# Patient Record
Sex: Female | Born: 1945 | Race: White | Hispanic: No | Marital: Married
Health system: Midwestern US, Academic
[De-identification: ages and names within clinical notes are randomized; demographics above are authoritative.]

---

## 2016-08-29 ENCOUNTER — Encounter: Admit: 2016-08-29 | Discharge: 2016-08-29 | Payer: MEDICARE

## 2016-09-12 ENCOUNTER — Ambulatory Visit: Admit: 2016-09-12 | Discharge: 2016-09-13 | Payer: MEDICARE

## 2016-09-12 DIAGNOSIS — E6609 Other obesity due to excess calories: ICD-10-CM

## 2016-09-12 DIAGNOSIS — E7800 Pure hypercholesterolemia, unspecified: ICD-10-CM

## 2016-09-12 DIAGNOSIS — I10 Essential (primary) hypertension: Principal | ICD-10-CM

## 2016-09-12 MED ORDER — REGADENOSON 0.4 MG/5 ML IV SYRG
.4 mg | Freq: Once | INTRAVENOUS | 0 refills | Status: CP
Start: 2016-09-12 — End: ?

## 2016-09-12 NOTE — Progress Notes
Peripheral IV Insertion Note:  Patient Side: left  Line Orientation:Hand  IV Catheter Size: 22G  Number of Attempts:1.  IV capped and flushed with Normal Saline.  IV site without redness, swelling, or pain.  New dressing placed.    After procedure IV cannula removed intact and hemostasis achieved.

## 2016-10-21 ENCOUNTER — Encounter: Admit: 2016-10-21 | Discharge: 2016-10-21 | Payer: MEDICARE

## 2016-10-21 MED ORDER — METOPROLOL SUCCINATE 50 MG PO TB24
25 mg | ORAL_TABLET | Freq: Two times a day (BID) | ORAL | 3 refills | 90.00000 days | Status: AC
Start: 2016-10-21 — End: 2018-09-14

## 2017-03-12 ENCOUNTER — Encounter: Admit: 2017-03-12 | Discharge: 2017-03-12 | Payer: MEDICARE

## 2017-03-12 DIAGNOSIS — K5090 Crohn's disease, unspecified, without complications: Principal | ICD-10-CM

## 2017-03-12 DIAGNOSIS — M810 Age-related osteoporosis without current pathological fracture: ICD-10-CM

## 2017-03-12 DIAGNOSIS — R413 Other amnesia: ICD-10-CM

## 2017-03-12 DIAGNOSIS — I471 Supraventricular tachycardia: ICD-10-CM

## 2017-03-12 DIAGNOSIS — E785 Hyperlipidemia, unspecified: ICD-10-CM

## 2017-03-12 DIAGNOSIS — G43909 Migraine, unspecified, not intractable, without status migrainosus: ICD-10-CM

## 2017-03-12 DIAGNOSIS — R002 Palpitations: ICD-10-CM

## 2017-03-12 DIAGNOSIS — G8929 Other chronic pain: ICD-10-CM

## 2017-03-12 DIAGNOSIS — M797 Fibromyalgia: ICD-10-CM

## 2017-03-12 DIAGNOSIS — M1990 Unspecified osteoarthritis, unspecified site: ICD-10-CM

## 2017-03-12 DIAGNOSIS — R32 Unspecified urinary incontinence: ICD-10-CM

## 2017-03-12 DIAGNOSIS — G2581 Restless legs syndrome: ICD-10-CM

## 2017-03-12 DIAGNOSIS — I10 Essential (primary) hypertension: ICD-10-CM

## 2017-03-12 DIAGNOSIS — J45909 Unspecified asthma, uncomplicated: ICD-10-CM

## 2017-03-12 NOTE — Telephone Encounter
Initial Visit Date: 04/06/17    Reason for Visit: memory loss    Is this as a result of an injury? head injury due to fall- admission at [facility] bilateral subdural hematoma 05/23/15    Physician Information  PCP: Dr. Larina Bras  Referring Physician: Dr. Orson Gear at [facility] and [facility]  Previous Neurologist: Dr. Hoyt Koch [facility]  Other providers seen: Dr. Artis Delay [place] [facility], [facility], Dr. Mirian Mo [facility], Dr. Nickolas Madrid [place] [facility], Dr. Jackquline [place] ortho    Previous Imaging/Procedures:  CT head- 07/03/15, 06/05/15, 05/25/15, 05/24/15 [place]; 05/23/15 [facility] (OR); 02/28/11, 01/16/15?  CT C-spine- 05/23/15 [facility]/OR; 05/24/15 [place]  CT T-spine- 05/24/15 [place]  CT L-spine- 05/24/15 [place]  MRI head- 02/12/15?  MRI C-spine- 05/24/15 [place]; 1984  MRI L-spine- 11/17/13 [place]  XRay L-spine- 10/06/13 [place]; 10/25/15?  EMG none  EEG 02/21/15 [facility] (OR) 04/05/15?  Sleep study- 01/29/13  US carotid- 05/23/15 [place]    ED/Hospitalizations:  [facility] ER 11/23/15 nose bleed; 10/26/15 fall; 05/23/15 fall (OR); 01/16/15 facial numbness, headache;   [facility] admission 05/23/15 fall    Current Meds:  donepezil    Medications, Allergies, History Updated

## 2017-04-06 ENCOUNTER — Ambulatory Visit: Admit: 2017-04-06 | Discharge: 2017-04-07 | Payer: MEDICARE

## 2017-04-06 ENCOUNTER — Encounter: Admit: 2017-04-06 | Discharge: 2017-04-06 | Payer: MEDICARE

## 2017-04-06 DIAGNOSIS — G43909 Migraine, unspecified, not intractable, without status migrainosus: ICD-10-CM

## 2017-04-06 DIAGNOSIS — M1990 Unspecified osteoarthritis, unspecified site: ICD-10-CM

## 2017-04-06 DIAGNOSIS — I471 Supraventricular tachycardia: ICD-10-CM

## 2017-04-06 DIAGNOSIS — J45909 Unspecified asthma, uncomplicated: ICD-10-CM

## 2017-04-06 DIAGNOSIS — R32 Unspecified urinary incontinence: ICD-10-CM

## 2017-04-06 DIAGNOSIS — M810 Age-related osteoporosis without current pathological fracture: ICD-10-CM

## 2017-04-06 DIAGNOSIS — M797 Fibromyalgia: ICD-10-CM

## 2017-04-06 DIAGNOSIS — K5090 Crohn's disease, unspecified, without complications: Principal | ICD-10-CM

## 2017-04-06 DIAGNOSIS — E785 Hyperlipidemia, unspecified: ICD-10-CM

## 2017-04-06 DIAGNOSIS — R002 Palpitations: ICD-10-CM

## 2017-04-06 DIAGNOSIS — I10 Essential (primary) hypertension: ICD-10-CM

## 2017-04-06 DIAGNOSIS — G8929 Other chronic pain: ICD-10-CM

## 2017-04-06 DIAGNOSIS — R413 Other amnesia: ICD-10-CM

## 2017-04-06 DIAGNOSIS — G2581 Restless legs syndrome: ICD-10-CM

## 2017-04-07 DIAGNOSIS — R4189 Other symptoms and signs involving cognitive functions and awareness: Principal | ICD-10-CM

## 2017-04-07 LAB — LIVER FUNCTION PANEL
Lab: 0.1 mg/dL (ref <=0.2)
Lab: 0.4 mg/dL (ref 0.2–1.2)
Lab: 2.5 g/dL (ref >=60)
Lab: 4.5 g/dL (ref <9)
Lab: 7 g/dL (ref 6.1–8.1)

## 2017-04-07 LAB — VITAMIN B12: Lab: 101 pg/mL (ref >=60)

## 2017-04-21 ENCOUNTER — Encounter: Admit: 2017-04-21 | Discharge: 2017-04-21 | Payer: MEDICARE

## 2017-05-04 ENCOUNTER — Ambulatory Visit: Admit: 2017-05-04 | Discharge: 2017-05-04 | Payer: MEDICARE

## 2017-05-04 ENCOUNTER — Encounter: Admit: 2017-05-04 | Discharge: 2017-05-04 | Payer: MEDICARE

## 2017-05-04 DIAGNOSIS — R413 Other amnesia: ICD-10-CM

## 2017-05-04 DIAGNOSIS — M797 Fibromyalgia: ICD-10-CM

## 2017-05-04 DIAGNOSIS — S06361A Traumatic hemorrhage of cerebrum, unspecified, with loss of consciousness of 30 minutes or less, initial encounter: ICD-10-CM

## 2017-05-04 DIAGNOSIS — R4189 Other symptoms and signs involving cognitive functions and awareness: Principal | ICD-10-CM

## 2017-05-04 DIAGNOSIS — G2581 Restless legs syndrome: ICD-10-CM

## 2017-05-04 DIAGNOSIS — S069X9A Unspecified intracranial injury with loss of consciousness of unspecified duration, initial encounter: ICD-10-CM

## 2017-05-04 DIAGNOSIS — G8929 Other chronic pain: ICD-10-CM

## 2017-05-04 DIAGNOSIS — M810 Age-related osteoporosis without current pathological fracture: ICD-10-CM

## 2017-05-04 DIAGNOSIS — R32 Unspecified urinary incontinence: ICD-10-CM

## 2017-05-04 DIAGNOSIS — S06300S Unspecified focal traumatic brain injury without loss of consciousness, sequela: ICD-10-CM

## 2017-05-04 DIAGNOSIS — E785 Hyperlipidemia, unspecified: ICD-10-CM

## 2017-05-04 DIAGNOSIS — M1990 Unspecified osteoarthritis, unspecified site: ICD-10-CM

## 2017-05-04 DIAGNOSIS — I471 Supraventricular tachycardia: ICD-10-CM

## 2017-05-04 DIAGNOSIS — I10 Essential (primary) hypertension: ICD-10-CM

## 2017-05-04 DIAGNOSIS — R002 Palpitations: ICD-10-CM

## 2017-05-04 DIAGNOSIS — K5090 Crohn's disease, unspecified, without complications: Principal | ICD-10-CM

## 2017-05-04 DIAGNOSIS — J45909 Unspecified asthma, uncomplicated: ICD-10-CM

## 2017-05-04 DIAGNOSIS — G43909 Migraine, unspecified, not intractable, without status migrainosus: ICD-10-CM

## 2017-06-29 ENCOUNTER — Encounter: Admit: 2017-06-29 | Discharge: 2017-06-29 | Payer: MEDICARE

## 2017-06-29 ENCOUNTER — Ambulatory Visit: Admit: 2017-06-29 | Discharge: 2017-06-30 | Payer: MEDICARE

## 2017-06-29 DIAGNOSIS — I471 Supraventricular tachycardia: ICD-10-CM

## 2017-06-29 DIAGNOSIS — M797 Fibromyalgia: ICD-10-CM

## 2017-06-29 DIAGNOSIS — I10 Essential (primary) hypertension: ICD-10-CM

## 2017-06-29 DIAGNOSIS — R32 Unspecified urinary incontinence: ICD-10-CM

## 2017-06-29 DIAGNOSIS — J45909 Unspecified asthma, uncomplicated: ICD-10-CM

## 2017-06-29 DIAGNOSIS — R413 Other amnesia: ICD-10-CM

## 2017-06-29 DIAGNOSIS — G2581 Restless legs syndrome: ICD-10-CM

## 2017-06-29 DIAGNOSIS — R002 Palpitations: ICD-10-CM

## 2017-06-29 DIAGNOSIS — M810 Age-related osteoporosis without current pathological fracture: ICD-10-CM

## 2017-06-29 DIAGNOSIS — G43909 Migraine, unspecified, not intractable, without status migrainosus: ICD-10-CM

## 2017-06-29 DIAGNOSIS — R4189 Other symptoms and signs involving cognitive functions and awareness: Principal | ICD-10-CM

## 2017-06-29 DIAGNOSIS — E785 Hyperlipidemia, unspecified: ICD-10-CM

## 2017-06-29 DIAGNOSIS — S069X9A Unspecified intracranial injury with loss of consciousness of unspecified duration, initial encounter: ICD-10-CM

## 2017-06-29 DIAGNOSIS — K5090 Crohn's disease, unspecified, without complications: Principal | ICD-10-CM

## 2017-06-29 DIAGNOSIS — M1990 Unspecified osteoarthritis, unspecified site: ICD-10-CM

## 2017-06-29 DIAGNOSIS — G8929 Other chronic pain: ICD-10-CM

## 2017-11-12 ENCOUNTER — Encounter: Admit: 2017-11-12 | Discharge: 2017-11-12 | Payer: MEDICARE

## 2017-11-12 ENCOUNTER — Ambulatory Visit: Admit: 2017-11-12 | Discharge: 2017-11-13 | Payer: MEDICARE

## 2017-11-12 DIAGNOSIS — K5090 Crohn's disease, unspecified, without complications: Principal | ICD-10-CM

## 2017-11-12 DIAGNOSIS — M797 Fibromyalgia: ICD-10-CM

## 2017-11-12 DIAGNOSIS — G8929 Other chronic pain: ICD-10-CM

## 2017-11-12 DIAGNOSIS — K50919 Crohn's disease, unspecified, with unspecified complications: ICD-10-CM

## 2017-11-12 DIAGNOSIS — R32 Unspecified urinary incontinence: ICD-10-CM

## 2017-11-12 DIAGNOSIS — I10 Essential (primary) hypertension: ICD-10-CM

## 2017-11-12 DIAGNOSIS — M810 Age-related osteoporosis without current pathological fracture: ICD-10-CM

## 2017-11-12 DIAGNOSIS — E6609 Other obesity due to excess calories: ICD-10-CM

## 2017-11-12 DIAGNOSIS — I471 Supraventricular tachycardia: ICD-10-CM

## 2017-11-12 DIAGNOSIS — W19XXXA Unspecified fall, initial encounter: ICD-10-CM

## 2017-11-12 DIAGNOSIS — G2581 Restless legs syndrome: ICD-10-CM

## 2017-11-12 DIAGNOSIS — R413 Other amnesia: ICD-10-CM

## 2017-11-12 DIAGNOSIS — S06361A Traumatic hemorrhage of cerebrum, unspecified, with loss of consciousness of 30 minutes or less, initial encounter: ICD-10-CM

## 2017-11-12 DIAGNOSIS — M1990 Unspecified osteoarthritis, unspecified site: ICD-10-CM

## 2017-11-12 DIAGNOSIS — E785 Hyperlipidemia, unspecified: ICD-10-CM

## 2017-11-12 DIAGNOSIS — S069X9A Unspecified intracranial injury with loss of consciousness of unspecified duration, initial encounter: ICD-10-CM

## 2017-11-12 DIAGNOSIS — R002 Palpitations: ICD-10-CM

## 2017-11-12 DIAGNOSIS — G43909 Migraine, unspecified, not intractable, without status migrainosus: ICD-10-CM

## 2017-11-12 DIAGNOSIS — S02119A Unspecified fracture of occiput, initial encounter for closed fracture: ICD-10-CM

## 2017-11-12 DIAGNOSIS — J45909 Unspecified asthma, uncomplicated: ICD-10-CM

## 2017-11-12 DIAGNOSIS — E7800 Pure hypercholesterolemia, unspecified: Principal | ICD-10-CM

## 2017-11-16 LAB — LIPID PROFILE
Lab: 144 — ABNORMAL LOW (ref 150–200)
Lab: 146
Lab: 44
Lab: 80

## 2017-11-16 LAB — LIVER FUNCTION PANEL: Lab: 0.7

## 2017-11-17 ENCOUNTER — Encounter: Admit: 2017-11-17 | Discharge: 2017-11-17 | Payer: MEDICARE

## 2017-11-17 DIAGNOSIS — I10 Essential (primary) hypertension: ICD-10-CM

## 2017-11-17 DIAGNOSIS — E7800 Pure hypercholesterolemia, unspecified: Principal | ICD-10-CM

## 2018-03-25 ENCOUNTER — Encounter: Admit: 2018-03-25 | Discharge: 2018-03-25 | Payer: MEDICARE

## 2018-03-26 MED ORDER — ALLOPURINOL 300 MG PO TAB
ORAL_TABLET | Freq: Every day | 0 refills
Start: 2018-03-26 — End: ?

## 2018-06-24 ENCOUNTER — Encounter: Admit: 2018-06-24 | Discharge: 2018-06-24 | Payer: MEDICARE

## 2018-06-30 ENCOUNTER — Encounter: Admit: 2018-06-30 | Discharge: 2018-06-30 | Payer: MEDICARE

## 2018-06-30 ENCOUNTER — Ambulatory Visit: Admit: 2018-06-30 | Discharge: 2018-07-01 | Payer: MEDICARE

## 2018-06-30 DIAGNOSIS — K5090 Crohn's disease, unspecified, without complications: Principal | ICD-10-CM

## 2018-06-30 DIAGNOSIS — I10 Essential (primary) hypertension: ICD-10-CM

## 2018-06-30 DIAGNOSIS — S069X9A Unspecified intracranial injury with loss of consciousness of unspecified duration, initial encounter: ICD-10-CM

## 2018-06-30 DIAGNOSIS — M810 Age-related osteoporosis without current pathological fracture: ICD-10-CM

## 2018-06-30 DIAGNOSIS — R002 Palpitations: ICD-10-CM

## 2018-06-30 DIAGNOSIS — M797 Fibromyalgia: ICD-10-CM

## 2018-06-30 DIAGNOSIS — G2581 Restless legs syndrome: ICD-10-CM

## 2018-06-30 DIAGNOSIS — J45909 Unspecified asthma, uncomplicated: ICD-10-CM

## 2018-06-30 DIAGNOSIS — G43909 Migraine, unspecified, not intractable, without status migrainosus: ICD-10-CM

## 2018-06-30 DIAGNOSIS — M1990 Unspecified osteoarthritis, unspecified site: ICD-10-CM

## 2018-06-30 DIAGNOSIS — R32 Unspecified urinary incontinence: ICD-10-CM

## 2018-06-30 DIAGNOSIS — R413 Other amnesia: ICD-10-CM

## 2018-06-30 DIAGNOSIS — I471 Supraventricular tachycardia: ICD-10-CM

## 2018-06-30 DIAGNOSIS — E785 Hyperlipidemia, unspecified: ICD-10-CM

## 2018-06-30 DIAGNOSIS — G8929 Other chronic pain: ICD-10-CM

## 2018-06-30 MED ORDER — CITALOPRAM 20 MG PO TAB
20 mg | ORAL_TABLET | Freq: Every day | ORAL | 3 refills | Status: AC
Start: 2018-06-30 — End: ?

## 2018-06-30 NOTE — Progress Notes
Obtained patient's verbal consent to treat them and their agreement to [facility] financial policy and NPP via this telehealth visit during the [facility] Emergency    The patient reported some mood issues and has some problem in remembering names.     Neuropsychological testing did not show any evidence of an emerging dementia.       Recommendations;     1. Start a low dose of citalopram.       Audio + Video: Zoom    Time: 10 minutes.

## 2018-07-01 DIAGNOSIS — R4189 Other symptoms and signs involving cognitive functions and awareness: Principal | ICD-10-CM

## 2018-09-14 ENCOUNTER — Ambulatory Visit: Admit: 2018-09-14 | Discharge: 2018-09-15

## 2018-09-14 ENCOUNTER — Encounter: Admit: 2018-09-14 | Discharge: 2018-09-14

## 2018-09-14 DIAGNOSIS — M797 Fibromyalgia: Secondary | ICD-10-CM

## 2018-09-14 DIAGNOSIS — G8929 Other chronic pain: Secondary | ICD-10-CM

## 2018-09-14 DIAGNOSIS — R413 Other amnesia: Secondary | ICD-10-CM

## 2018-09-14 DIAGNOSIS — I10 Essential (primary) hypertension: Secondary | ICD-10-CM

## 2018-09-14 DIAGNOSIS — M1990 Unspecified osteoarthritis, unspecified site: Secondary | ICD-10-CM

## 2018-09-14 DIAGNOSIS — K5090 Crohn's disease, unspecified, without complications: Secondary | ICD-10-CM

## 2018-09-14 DIAGNOSIS — E785 Hyperlipidemia, unspecified: Secondary | ICD-10-CM

## 2018-09-14 DIAGNOSIS — S069X9A Unspecified intracranial injury with loss of consciousness of unspecified duration, initial encounter: Secondary | ICD-10-CM

## 2018-09-14 DIAGNOSIS — I471 Supraventricular tachycardia: Secondary | ICD-10-CM

## 2018-09-14 DIAGNOSIS — J45909 Unspecified asthma, uncomplicated: Secondary | ICD-10-CM

## 2018-09-14 DIAGNOSIS — E7800 Pure hypercholesterolemia, unspecified: Secondary | ICD-10-CM

## 2018-09-14 DIAGNOSIS — M810 Age-related osteoporosis without current pathological fracture: Secondary | ICD-10-CM

## 2018-09-14 DIAGNOSIS — R32 Unspecified urinary incontinence: Secondary | ICD-10-CM

## 2018-09-14 DIAGNOSIS — G2581 Restless legs syndrome: Secondary | ICD-10-CM

## 2018-09-14 DIAGNOSIS — G43909 Migraine, unspecified, not intractable, without status migrainosus: Secondary | ICD-10-CM

## 2018-09-14 DIAGNOSIS — R002 Palpitations: Secondary | ICD-10-CM

## 2018-09-14 MED ORDER — LEVOTHYROXINE 50 MCG PO TAB
50 ug | ORAL_TABLET | Freq: Every day | ORAL | 3 refills | 30.00000 days | Status: AC
Start: 2018-09-14 — End: ?

## 2018-09-14 MED ORDER — METOPROLOL SUCCINATE 50 MG PO TB24
50 mg | ORAL_TABLET | Freq: Two times a day (BID) | ORAL | 3 refills | 90.00000 days | Status: DC
Start: 2018-09-14 — End: 2019-09-21

## 2018-09-14 NOTE — Progress Notes
Date of Service: 09/14/2018    Jeanette Bush is a 73 y.o. female.       HPI     Patient is a very pleasant 73 year old white female, she was last seen in the office in August 2019.  She has a history of hypertension, hyperlipidemia, obesity, previous heart palpitations that have been very well contained and treated with beta-blocker, she also has fibromyalgia.    In the past patient did sustain a fall that resulted in bilateral subdural hematoma, she underwent physical rehabilitation and extensive physical therapy and recovered well.    Currently Mrs. Jeanette Bush is under a lot of stress and she is packing her house in anticipation of moving to [place], [place] at the end of this month where the husband accepted a new job as Associate Professor.    She has not had any symptoms of chest pain, no heart palpitations.  She has been experiencing shortness of breath with physical activity and she is also very fatigued, that is probably due to a lot of work in trying to pack up her house.         Vitals:    09/14/18 1400 09/14/18 1418   BP: 114/74 114/74   BP Source: Arm, Left Upper Arm, Right Upper   Pulse: 72    SpO2: 97%    Weight: 106.2 kg (234 lb 3.2 oz)    Height: 1.626 m (5' 4)    PainSc: Zero      Body mass index is 40.2 kg/m???.     Past Medical History  Patient Active Problem List    Diagnosis Date Noted   ??? Class 2 obesity due to excess calories in adult 01/01/2016   ??? Cervical spine pain 05/24/2015   ??? Cerebral edema (HCC) 05/24/2015   ??? Intraparenchymal hematoma of brain (HCC) 05/24/2015   ??? Closed fracture of occipital bone (HCC) 05/24/2015   ??? Subdural hematoma (HCC) 05/23/2015   ??? Altered mental status 05/23/2015   ??? Fall 05/23/2015   ??? Heart palpitations 07/21/2013   ??? Pre-operative cardiovascular examination 06/25/2011   ??? Obesity, Class III, BMI 40-49.9 (morbid obesity) (HCC) 06/25/2011   ??? Atypical chest pain 09/13/2010   ??? Morbidly obese (HCC) 09/13/2010   ??? Left axis deviation 09/13/2010 ??? Hyperlipidemia 06/12/2010   ??? SVT (supraventricular tachycardia) (HCC) 01/11/2010   ??? Palpitations 01/11/2010   ??? Crohn's disease (HCC) 06/14/2009   ??? Fibromyalgia 06/14/2009   ??? RLS (restless legs syndrome) 06/14/2009   ??? Asthma 06/14/2009   ??? HTN (hypertension) 06/14/2009   ??? Osteoporosis 06/14/2009   ??? Migraine 06/14/2009   ??? Chronic pain 06/14/2009   ??? Urinary incontinence 06/14/2009         Review of Systems   Constitution: Negative.   HENT: Negative.    Eyes: Negative.    Cardiovascular: Negative.    Respiratory: Negative.    Endocrine: Negative.    Hematologic/Lymphatic: Negative.    Skin: Negative.    Musculoskeletal: Negative.    Gastrointestinal: Negative.    Genitourinary: Negative.    Neurological: Negative.    Psychiatric/Behavioral: Negative.    Allergic/Immunologic: Negative.        Physical Exam  General Appearance: normal in appearance  Skin: warm, moist, no ulcers or xanthomas  Eyes: conjunctivae and lids normal, pupils are equal and round  Lips & Oral Mucosa: no pallor or cyanosis  Neck Veins: neck veins are flat, neck veins are not distended  Chest Inspection: chest is  normal in appearance  Respiratory Effort: breathing comfortably, no respiratory distress  Auscultation/Percussion: lungs clear to auscultation, no rales or rhonchi, no wheezing  Cardiac Rhythm: regular rhythm and normal rate  Cardiac Auscultation: S1, S2 normal, no rub, no gallop  Murmurs: no murmur  Carotid Arteries: normal carotid upstroke bilaterally, no bruit  Lower Extremity Edema: no lower extremity edema  Abdominal Exam: soft, non-tender, no masses, bowel sounds normal  Liver & Spleen: no organomegaly  Language and Memory: patient responsive and seems to comprehend information  Neurologic Exam: neurological assessment grossly intact      Cardiovascular Studies      Problems Addressed Today  Encounter Diagnoses   Name Primary?   ??? SVT (supraventricular tachycardia) (HCC) Yes   ??? Pure hypercholesterolemia ??? Essential hypertension    ??? Obesity, Class III, BMI 40-49.9 (morbid obesity) (HCC)    ??? Fibromyalgia        Assessment and Plan     In summary: This is a 73 year old white female that has a history of SVT documented in the past, patient also has symptomatic heart palpitations, she was started on a long-acting beta-blocker and her symptoms have been under good control and she has not had any significant recurrent episodes, in addition she does have a history of obesity with a BMI of 40.2, in the past patient did manage to lose 40 pounds, she also has a history of falls that resulted in bilateral subdural hematoma, patient was admitted at [facility] in February/March 2017, at that time she underwent extensive work-up for syncope and no significant pathology was discovered.    The the Foley resulted in occipital fracture and subdural hematoma with midline shift, patient was seen in neurosurgical consult, the recommended conservative therapy, patient recovered well following that episode.    Plan:    1.  Continue all current medications, will renew the prescription for levothyroxine and metoprolol XL  2.  After moving to [facility], [place] I do recommend to establish care with a primary care physician and continue to see a cardiologist.    Should have any questions feel free to call our office or he can call my cell phone at [phone].         Current Medications (including today's revisions)  ??? acetaminophen (TYLENOL) 500 mg tablet Take 1,000 mg by mouth as Needed.   ??? albuterol (PROAIR HFA) 90 mcg/actuation inhaler Inhale 1 Puff by mouth into the lungs every 6 hours as needed for Wheezing or Shortness of Breath. Shake well before use.   ??? allopurinol (ZYLOPRIM) 300 mg tablet Take 300 mg by mouth daily.     ??? aspirin 81 mg chewable tablet Chew 81 mg by mouth daily. Take with food.   ??? calcium carbonate/vitamin D-3 (OSCAL-500+D) 1250 mg/200 unit tablet Take 1 tablet by mouth daily. At noon, Calcium Carb 1250mg  delivers 500mg  elemental Ca   ??? cetirizine (ZYRTEC) 10 mg tablet Take 10 mg by mouth daily.   ??? citalopram (CELEXA) 20 mg tablet Take one tablet by mouth daily.   ??? docusate (COLACE) 100 mg capsule Take 100 mg by mouth twice daily.   ??? ergocalciferol (VITAMIN D-2) 50,000 unit capsule Take 1 capsule by mouth every 7 days.   ??? fluticasone propionate (FLONASE) 50 mcg/actuation nasal spray Apply  to each nostril as directed daily. Shake bottle gently before using.   ??? HYDROcodone/acetaminophen (NORCO) 5/325 mg tablet Take 1 tablet by mouth every 6 hours as needed     ???  ibuprofen (MOTRIN) 800 mg tablet Take 800 mg by mouth as Needed for Pain. Take with food.   ??? levothyroxine (SYNTHROID) 50 mcg tablet Take 50 mcg by mouth daily 30 minutes before breakfast.   ??? loperamide (IMODIUM A-D) 2 mg capsule Take 2-4 mg by mouth as Needed.   ??? metoprolol XL (TOPROL XL) 50 mg extended release tablet Take 0.5 tablets by mouth twice daily. (Patient taking differently: Take 50 mg by mouth twice daily.)   ??? montelukast (SINGULAIR) 10 mg tablet Take 10 mg by mouth at bedtime daily.   ??? MV with Min-Lycopene-Lutein (CENTRUM SILVER) 0.4-300-250 mg-mcg-mcg tab Take 1 tablet by mouth daily. At noon   ??? pantoprazole DR (PROTONIX) 40 mg tablet Take 40 mg by mouth twice daily.   ??? sulfADIAZINE 500 mg tablet Take 500 mg by mouth three times daily.   ??? sulfADIAZINE 500 mg tablet Take 1,000 mg by mouth three times daily.   ??? vitamins, B complex tab Take 1 tablet by mouth daily. At noon

## 2019-02-19 IMAGING — CR PELVIS
5 series · 5 of 5 positions shown · non-contrast
Comparison: none

[l-spine ap]
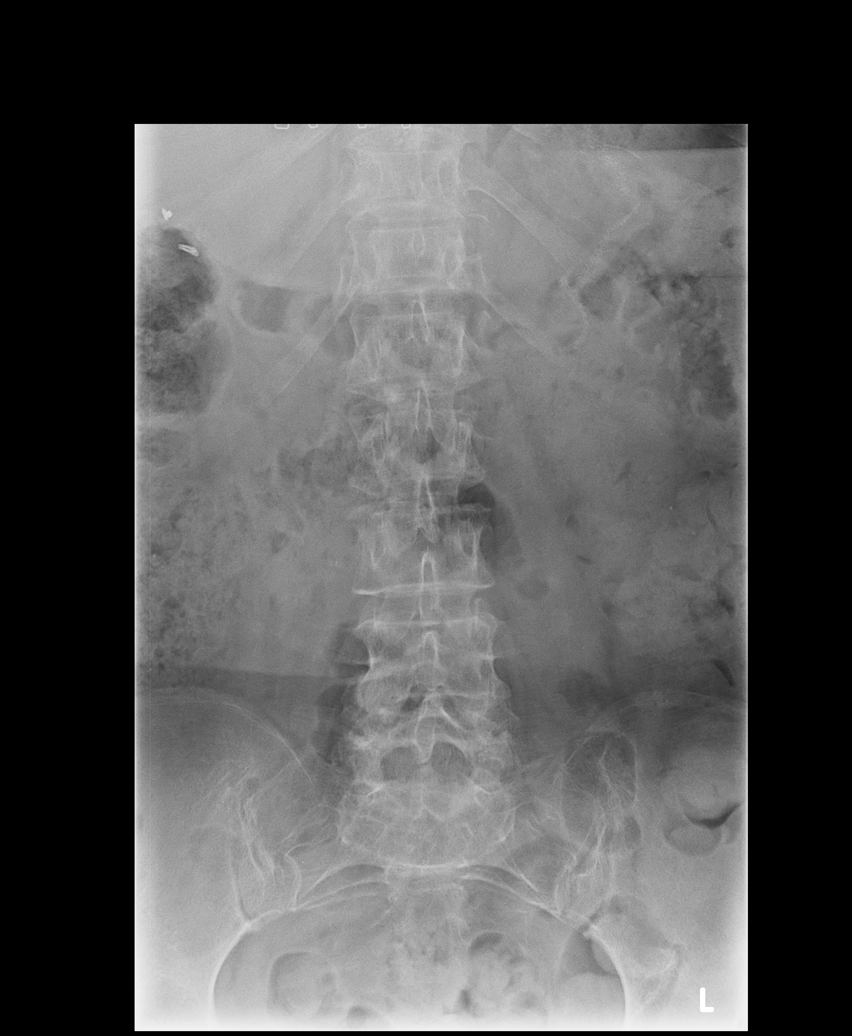

[l-spine obl (1 of 2)]
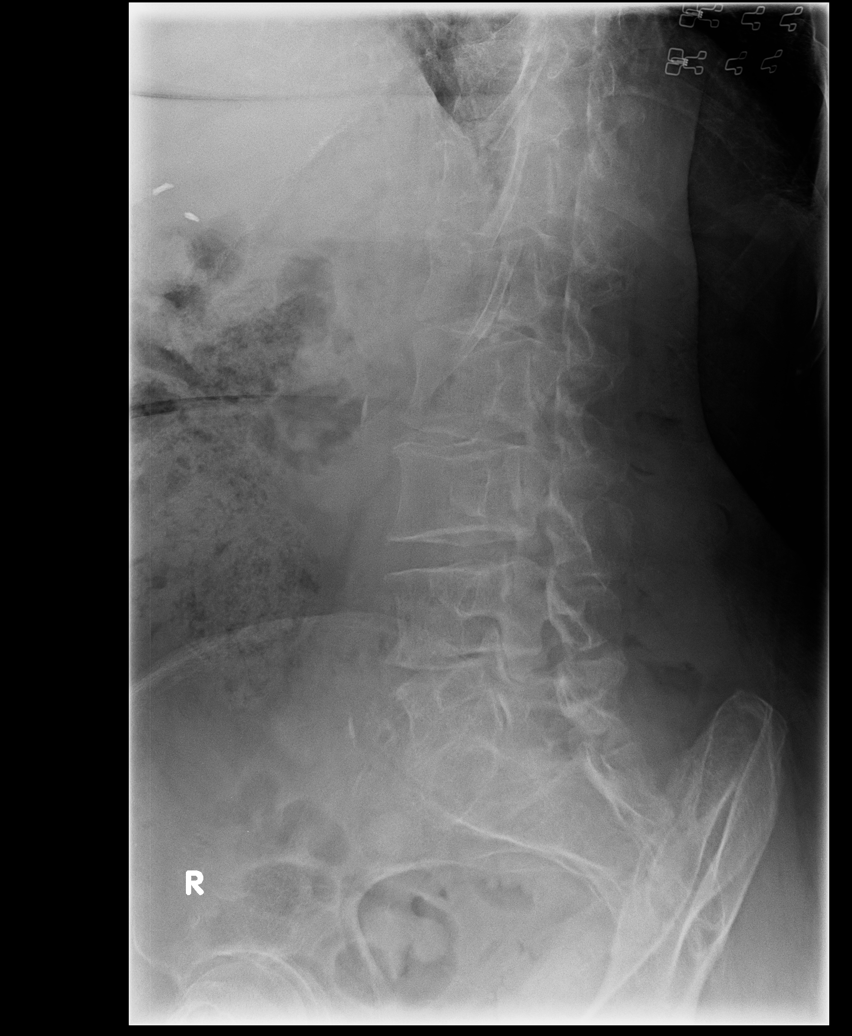

[l-spine obl (2 of 2)]
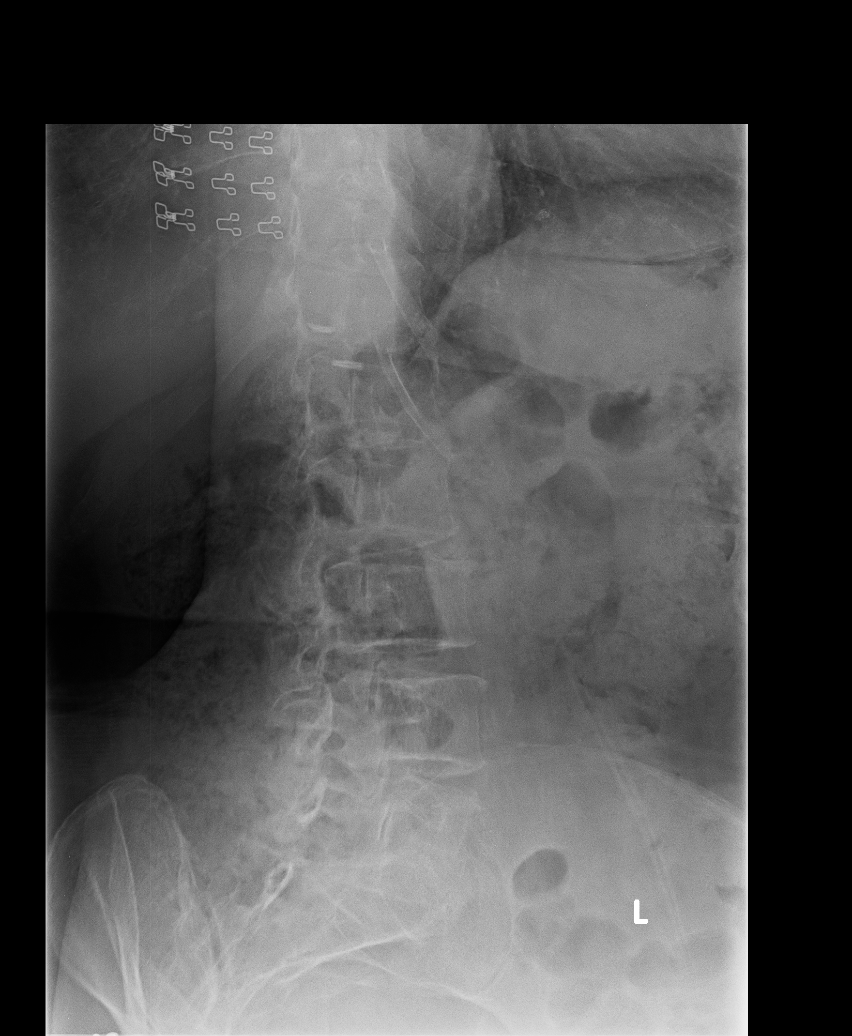

[l-spine lat]
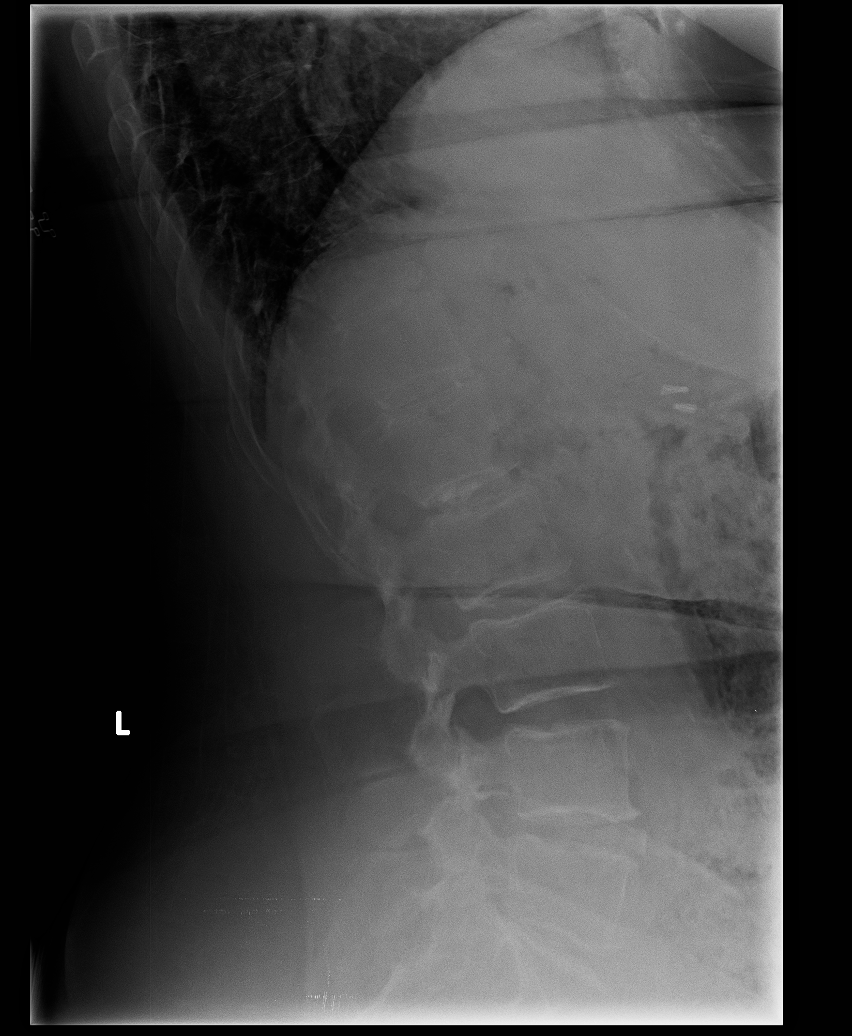

[l-spine l5-s1]
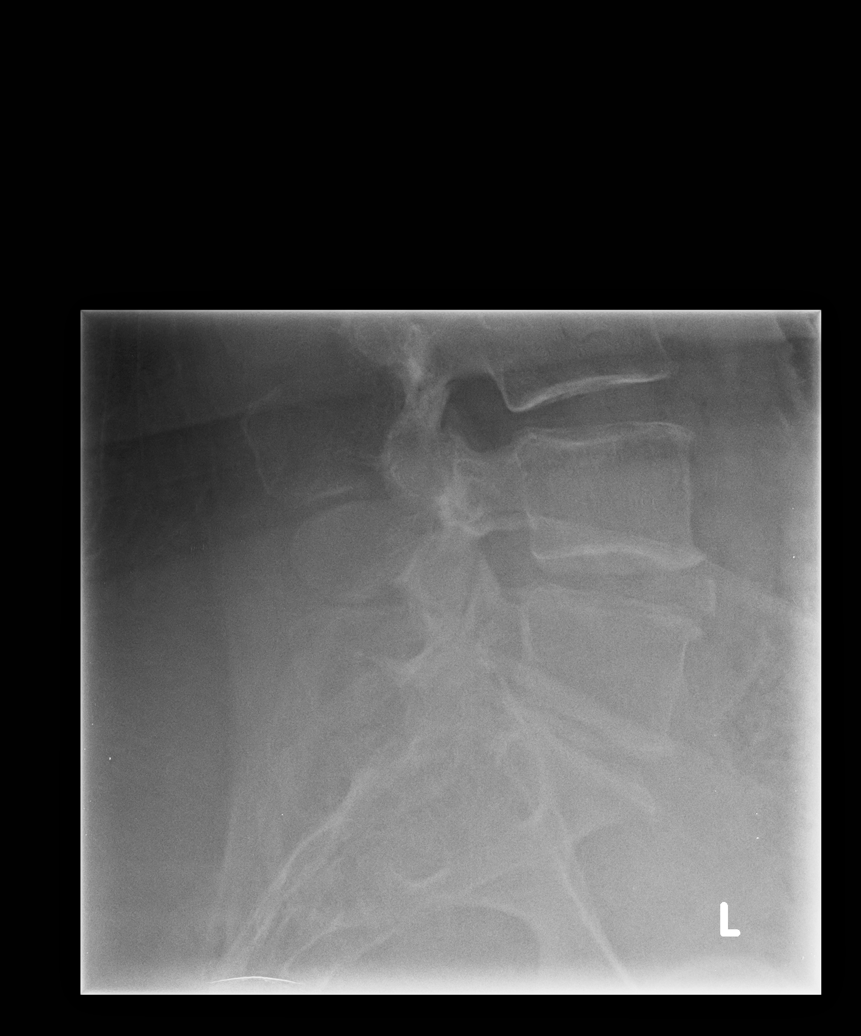

[5 of 5 positions shown; findings below may reference images not displayed]

DIAGNOSTIC STUDIES

EXAM
Lumbar spine radiograph

INDICATION
fall/pain
PT FELL OFF STOOL AFTER IT BROKE, LANDED ON TAILBONE. PAIN TO LOW BACK AND
TAILBONE. HX OF LAMENECTOMY TO L4-L5 "MANY, MANY YEARS AGO, WHEN I WAS
YOUNG" PER PT.

TECHNIQUE
Five views of the lumbar spine

COMPARISONS
Lumbar spine radiograph 04/03/2015

FINDINGS
The bones are demineralized, limiting evaluation. There are 5 non-rib-bearing lumbar vertebral
bodies. No acute fracture. Alignment is normal. Mild disc space narrowing, greatest at L5-S1. There
is moderate facet arthrosis at L4-5 and L5-S1. Cholecystectomy clips are noted. Suture material is
in the left upper quadrant.

IMPRESSION
No acute osseous abnormality. Unchanged mild degenerative disc disease. Osteopenia.

## 2019-02-19 IMAGING — CR PELVIS
3 series · 3 of 3 positions shown · non-contrast
Comparison: none

[sacrum-coccyx ap (1 of 2)]
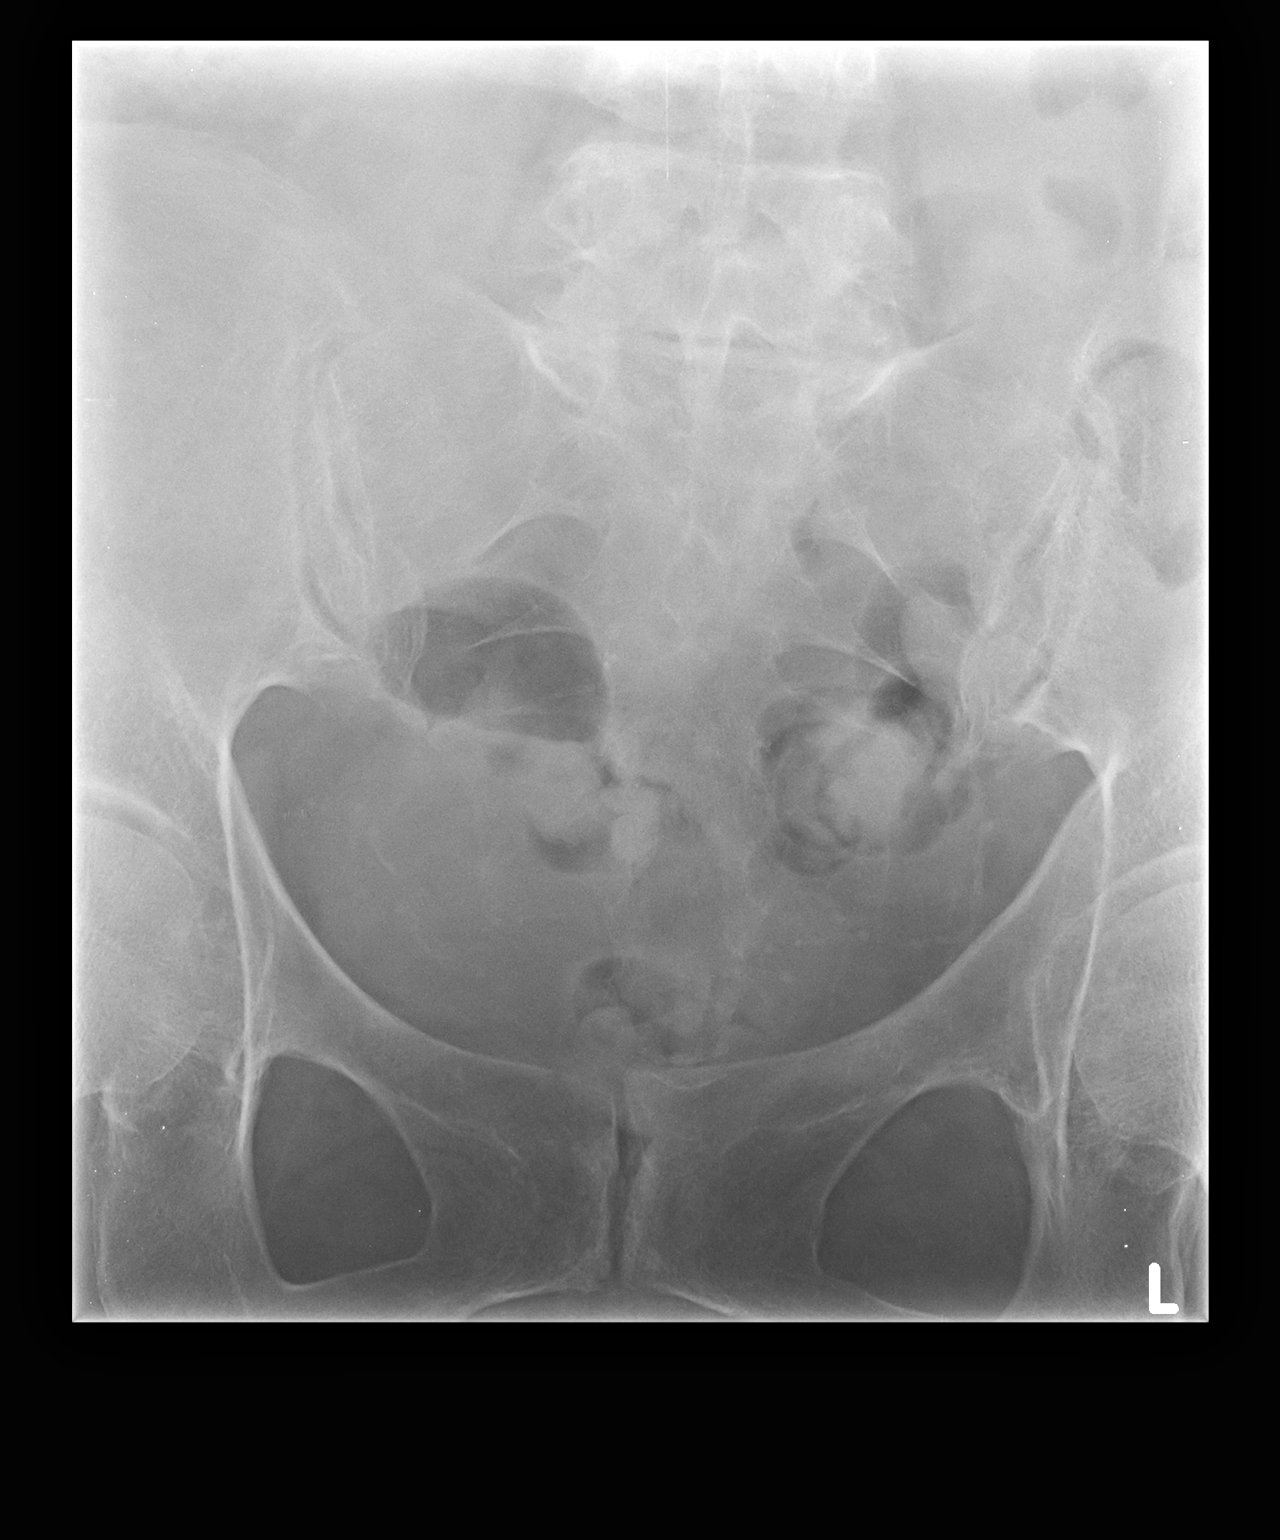

[sacrum-coccyx ap (2 of 2)]
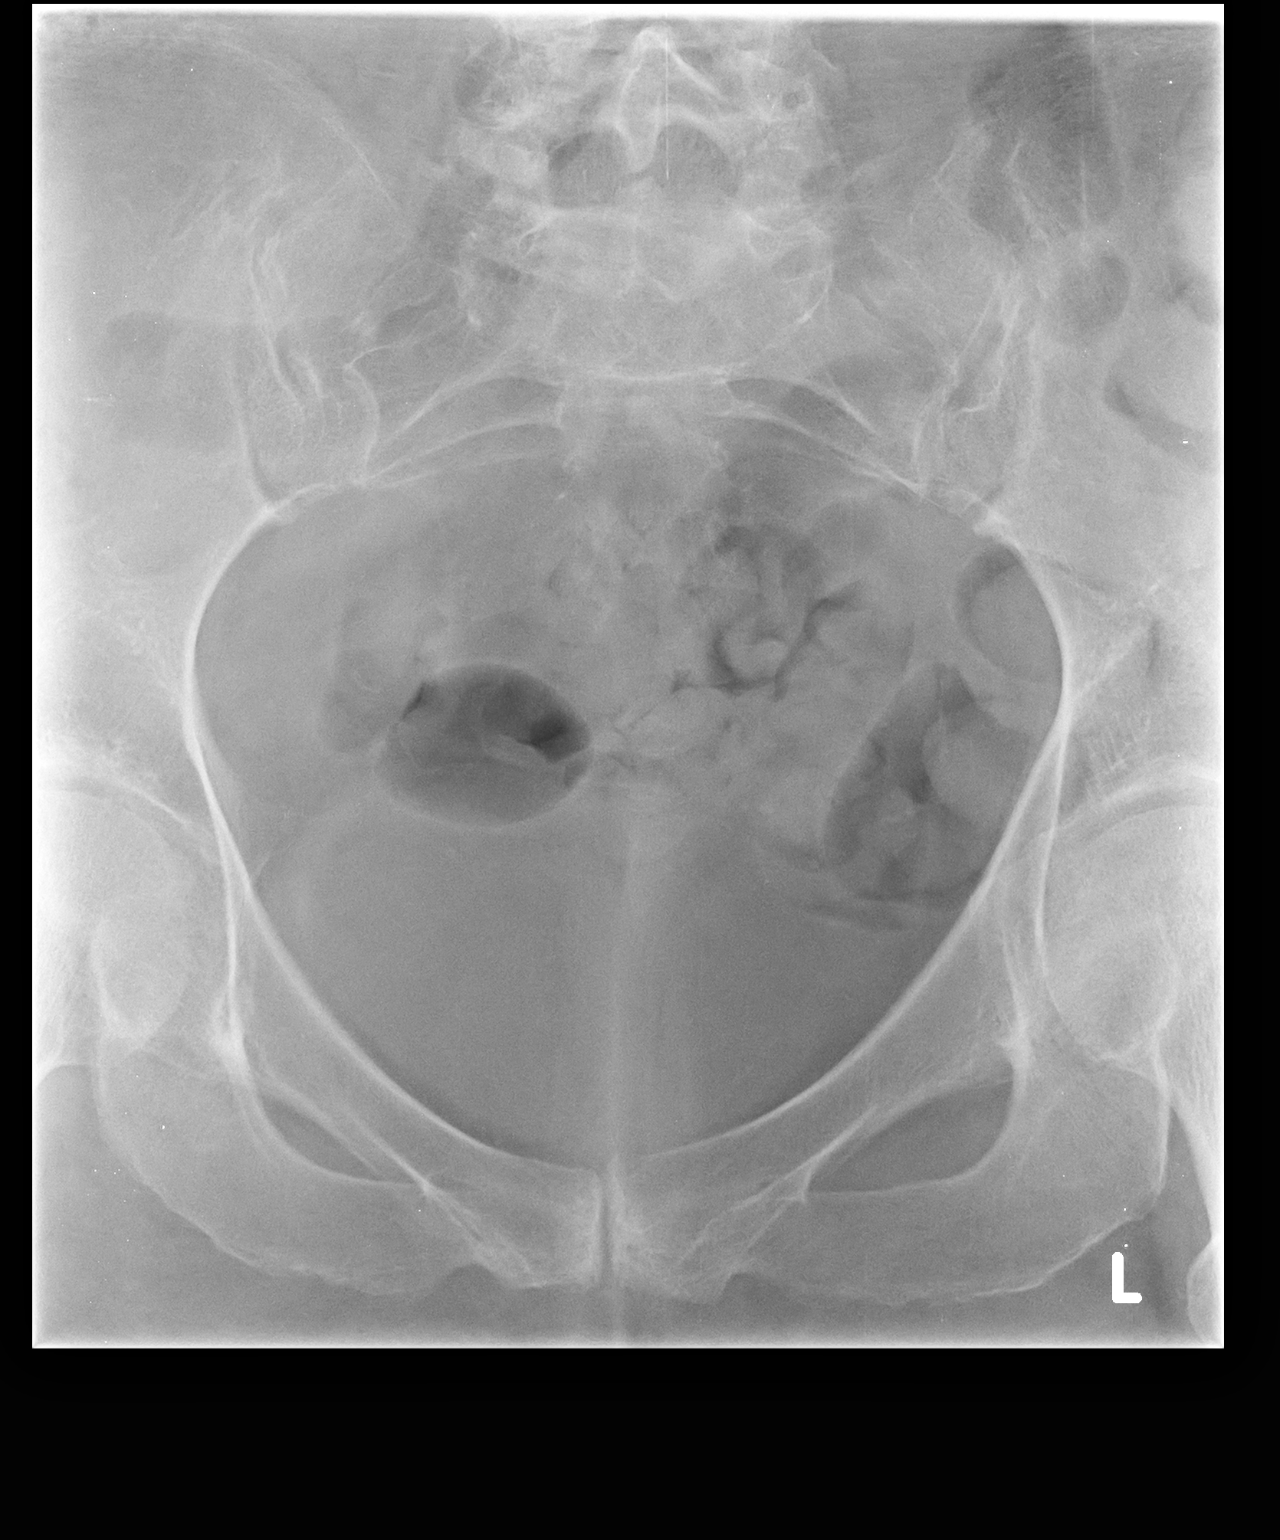

[sacrum-coccyx lat]
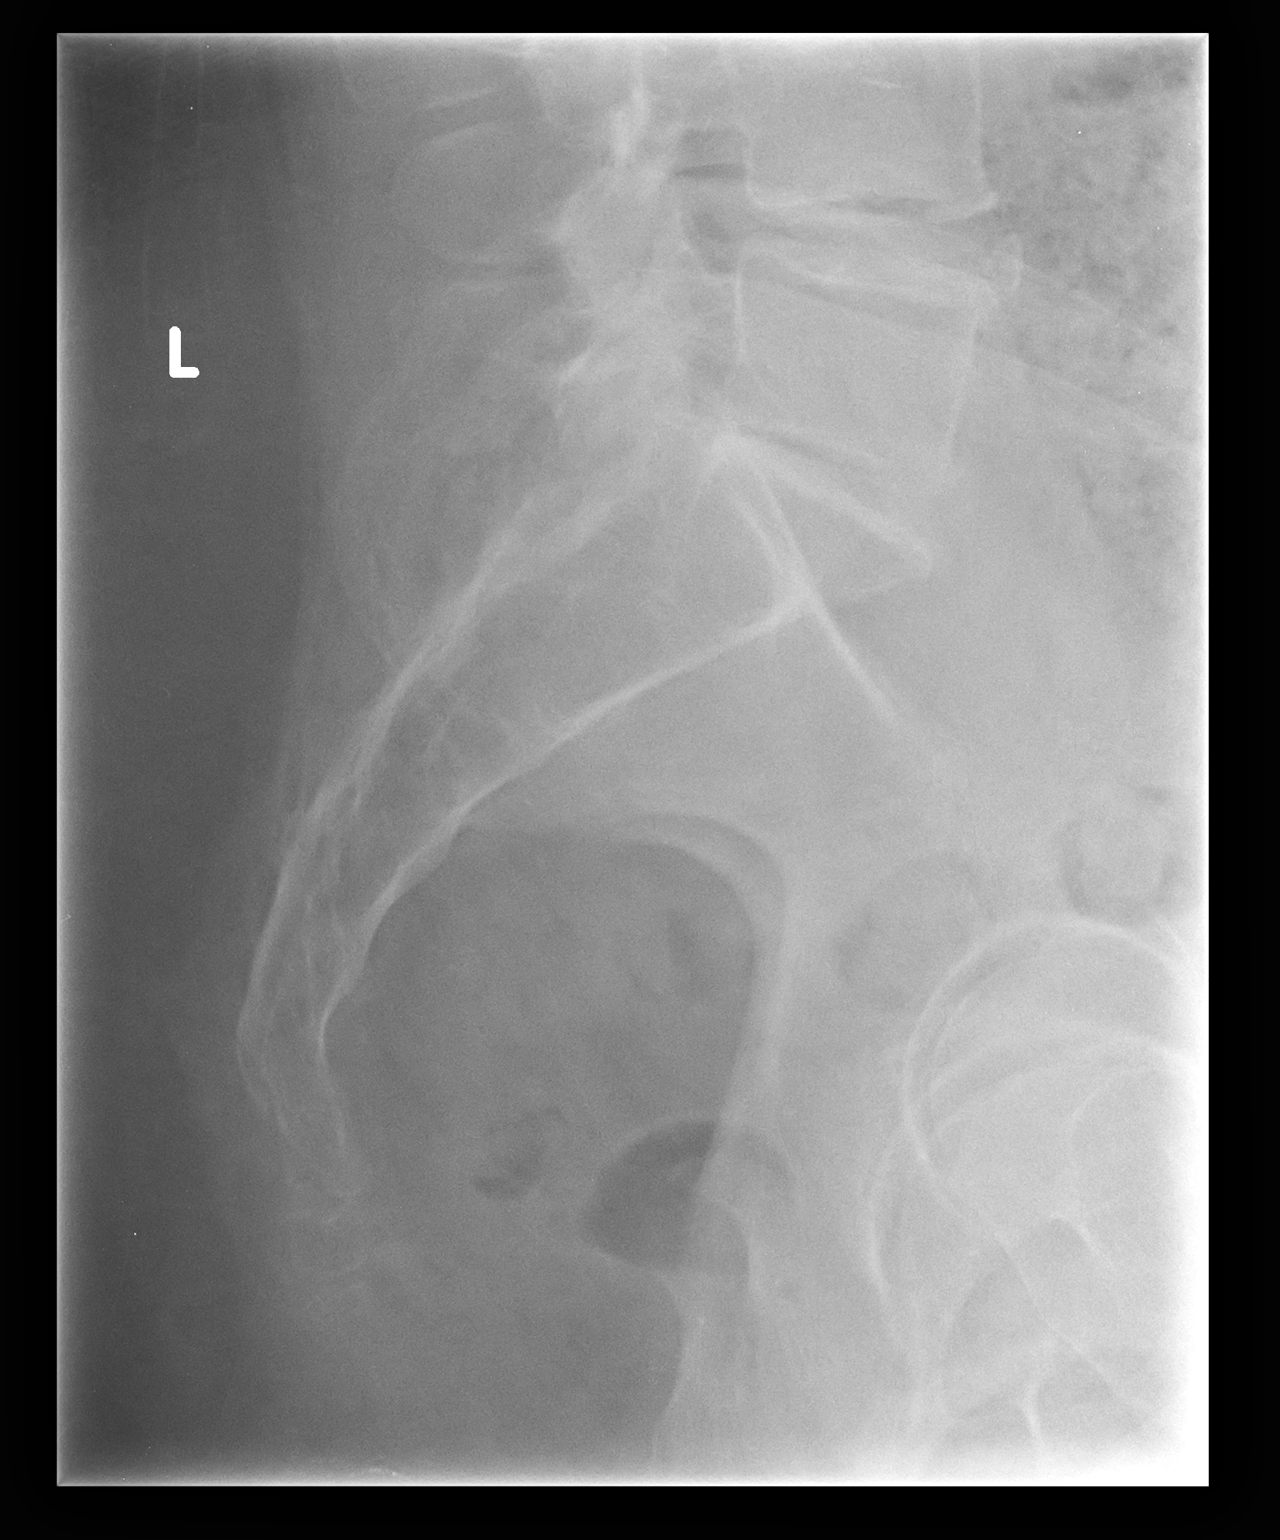

[3 of 3 positions shown; findings below may reference images not displayed]

DIAGNOSTIC STUDIES

EXAM
Sacrum and coccyx radiograph

INDICATION
fall/pain
PT FELL OFF STOOL AFTER IT BROKE, LANDED ON TAILBONE. PAIN TO LOW BACK AND
TAILBONE. HX OF LAMENECTOMY TO L4-L5 "MANY, MANY YEARS AGO, WHEN I WAS
YOUNG" PER PT.

TECHNIQUE
AP, axial, and lateral views of the sacrum and coccyx.

COMPARISONS
None

FINDINGS
The lower sacrum and coccyx are obscured by bowel on AP view. No definite fracture. Sacroiliac
joints are maintained.

IMPRESSION
No definite fracture.

## 2019-09-21 ENCOUNTER — Encounter: Admit: 2019-09-21 | Discharge: 2019-09-21 | Payer: MEDICARE

## 2019-09-21 MED ORDER — METOPROLOL SUCCINATE 50 MG PO TB24
ORAL_TABLET | Freq: Two times a day (BID) | ORAL | 3 refills | 90.00000 days | Status: AC
Start: 2019-09-21 — End: ?

## 2020-01-01 ENCOUNTER — Encounter: Admit: 2020-01-01 | Discharge: 2020-01-01 | Payer: MEDICARE

## 2020-01-01 MED ORDER — LEVOTHYROXINE 50 MCG PO TAB
ORAL_TABLET | Freq: Every morning | 3 refills
Start: 2020-01-01 — End: ?

## 2020-08-08 ENCOUNTER — Encounter: Admit: 2020-08-08 | Discharge: 2020-08-08 | Payer: MEDICARE

## 2020-08-08 MED ORDER — METOPROLOL SUCCINATE 50 MG PO TB24
ORAL_TABLET | Freq: Two times a day (BID) | ORAL | 3 refills | 90.00000 days | Status: AC
Start: 2020-08-08 — End: ?

## 2021-01-29 ENCOUNTER — Encounter: Admit: 2021-01-29 | Discharge: 2021-01-29 | Payer: MEDICARE

## 2021-02-12 ENCOUNTER — Ambulatory Visit: Admit: 2021-02-12 | Discharge: 2021-02-12 | Payer: MEDICARE

## 2021-02-12 ENCOUNTER — Encounter: Admit: 2021-02-12 | Discharge: 2021-02-12 | Payer: MEDICARE

## 2021-02-12 DIAGNOSIS — H269 Unspecified cataract: Secondary | ICD-10-CM

## 2021-02-12 DIAGNOSIS — I10 Essential (primary) hypertension: Secondary | ICD-10-CM

## 2021-02-12 DIAGNOSIS — R32 Unspecified urinary incontinence: Secondary | ICD-10-CM

## 2021-02-12 DIAGNOSIS — G894 Chronic pain syndrome: Secondary | ICD-10-CM

## 2021-02-12 DIAGNOSIS — E785 Hyperlipidemia, unspecified: Secondary | ICD-10-CM

## 2021-02-12 DIAGNOSIS — K5090 Crohn's disease, unspecified, without complications: Secondary | ICD-10-CM

## 2021-02-12 DIAGNOSIS — G2581 Restless legs syndrome: Secondary | ICD-10-CM

## 2021-02-12 DIAGNOSIS — Z9689 Presence of other specified functional implants: Secondary | ICD-10-CM

## 2021-02-12 DIAGNOSIS — M1990 Unspecified osteoarthritis, unspecified site: Secondary | ICD-10-CM

## 2021-02-12 DIAGNOSIS — M797 Fibromyalgia: Secondary | ICD-10-CM

## 2021-02-12 DIAGNOSIS — S069XAA Traumatic brain injury: Secondary | ICD-10-CM

## 2021-02-12 DIAGNOSIS — G8929 Other chronic pain: Secondary | ICD-10-CM

## 2021-02-12 DIAGNOSIS — M810 Age-related osteoporosis without current pathological fracture: Secondary | ICD-10-CM

## 2021-02-12 DIAGNOSIS — G43909 Migraine, unspecified, not intractable, without status migrainosus: Secondary | ICD-10-CM

## 2021-02-12 DIAGNOSIS — J45909 Unspecified asthma, uncomplicated: Secondary | ICD-10-CM

## 2021-02-12 DIAGNOSIS — R413 Other amnesia: Secondary | ICD-10-CM

## 2021-02-12 DIAGNOSIS — R002 Palpitations: Secondary | ICD-10-CM

## 2021-02-12 DIAGNOSIS — I471 Supraventricular tachycardia: Secondary | ICD-10-CM

## 2021-02-12 NOTE — Progress Notes
[facility] HISTORY AND PHYSICAL         HISTORY OF PRESENT ILLNESS:      Referring physician: Dwaine Gale    Chief complaint: New Patient      Jeanette Bush is a 75 y.o. female who  has a past medical history of Arthritis, Asthma (06/14/2009), Cataract (04/15/2015), Chronic pain (06/14/2009), Crohn's disease (HCC) (06/14/2009), Fibromyalgia (06/14/2009), HTN (hypertension) (06/14/2009), Hyperlipidemia (06/12/2010), Memory loss, Migraine (06/14/2009), Osteoporosis (06/14/2009), Palpitations (01/11/2010), RLS (restless legs syndrome) (06/14/2009), SVT (supraventricular tachycardia) (HCC) (01/11/2010), Traumatic brain injury, and Urinary incontinence (06/14/2009).  Patient-entered data is noted with quotes.    Pain location: Middle back  Pain radiation:   Does the pain move into your arm or leg?: Yes  Explanation of how far pain moves:: All over  Pain start date: Greater than 1 year     Inciting event: Yes Falls at home     Description of pain: Aching, Stabbing, Tiring, Nagging, Shooting, Burning, Penetrating, Throbbing, Sharp, Gnawing, Unbearable    Numbness/tingling/weakness: Arm, Leg      Bladder or bowel retention or incontinence?: No  Exacerbating factors: Stand, Bend      Alleviating factors: Rest, Heat, Medications      Imaging:   Have you had an X-ray for this condition?: Yes  Have you had an MRI for this condition?: Yes  Physical therapy/home exercise:   Have you had physical therapy for this condition?: Yes  Anticoagulants:   Are you taking one of the following blood thinners?: No    Is a is a very pleasant 75 year old female who presents with chronic pain has been going on for multiple years.  She describes laminectomy in 1993 for low back pain.  She also notes history of chronic pain management [place], the [facility] area.  She has spinal cord stimulator placed 1 year ago for possibly low back and thoracic pain.  She also notes that she had a stimulator placed for urinary incontinence earlier this year.  She has a history of chronic neck pain, for which she has received multiple cervical epidural steroid injections in [place] in the past.  These have helped minimally.  She also notes that she recently has seen a pain physician in [place], who tried an SI joint injection.  This did not help very much.  She has diffuse pain all over, sharp and aching pain in multiple areas of her body, multiple joints.  She has no dermatomal distribution specifically of her pain.  She notes that all areas of her body hurt except from the knees downward, and the hands.  She takes 1 pill of Norco per day, and is currently looking for a prescriber to continue this intensity.  She denies bowel or bladder dysfunction, she denies new injury.  She has tried over-the-counter medicines without improvement, has tried physical therapy in the past without improvement, but has not tried physical therapy recently.  She is here inquiring about a pain pump.  She takes 1 pill of Norco per day, has no side effects with her medications, and has diffuse pain all over her body.  She has daily headaches, fatigue, and poor sleep and concentration.       PRIOR MEDICATIONS (C = current medication):   Effective  Norco    Ineffective  Tylenol  Ibuprofen    Unable to tolerate      Never/Unknown  Gabapentin   Pregabalin  Ami/Nortriptyline  Duloxetine  Tizanidine  Methocarbamol  Baclofen  Cyclobenzaprine  PRIOR INTERVENTIONS:   Effective  Nevro SCS implantation - possible for thoracic and low back pain - [facility] 2021  Low back laminectomy 1993    Ineffective  Multiple CESI in [place]  SI Joint Injection    Pain diagram:               Medical History:   Diagnosis Date   ? Arthritis    ? Asthma 06/14/2009   ? Cataract 04/15/2015   ? Chronic pain 06/14/2009   ? Crohn's disease (HCC) 06/14/2009   ? Fibromyalgia 06/14/2009   ? HTN (hypertension) 06/14/2009   ? Hyperlipidemia 06/12/2010   ? Memory loss    ? Migraine 06/14/2009   ? Osteoporosis 06/14/2009   ? Palpitations 01/11/2010   ? RLS (restless legs syndrome) 06/14/2009   ? SVT (supraventricular tachycardia) (HCC) 01/11/2010   ? Traumatic brain injury    ? Urinary incontinence 06/14/2009         Surgical History:   Procedure Laterality Date   ? HX DILATION AND CURETTAGE  1978   ? HX HYSTERECTOMY  1980   ? LAMINECTOMY  1993    L4-L5   ? HAND SURGERY Left 1995    bone spur removal   ? EYE SURGERY Right 1998   ? GALLBLADDER SURGERY  11/2002    stones   ? STOMACH SURGERY  07/21/2011    sleeve   ? CATARACT REMOVAL Left 08/28/2016   ? HX LUMPECTOMY Left 1988, 02/02/12         Allergies   Allergen Reactions   ? Codeine ANXIETY and SEIZURES   ? Morphine ANXIETY and SEIZURES   ? Mobic [Meloxicam] HIVES and RASH   ? Pregabalin HIVES and RASH          ? Prolia [Denosumab] MUSCLE PAIN   ? Celebrex [Celecoxib] FLUSHING (SKIN) and REDNESS   ? Gluten STOMACH UPSET   ? Wheat HEADACHE and SEE COMMENTS     Sinus infections         Current Outpatient Medications on File Prior to Visit   Medication Sig Dispense Refill   ? acetaminophen (TYLENOL) 500 mg tablet Take 1,000 mg by mouth as Needed.     ? albuterol sulfate (PROAIR HFA) 90 mcg/actuation HFA aerosol inhaler Inhale 1 Puff by mouth into the lungs every 6 hours as needed for Wheezing or Shortness of Breath. Shake well before use.     ? allopurinol (ZYLOPRIM) 300 mg tablet Take 300 mg by mouth daily.       ? aspirin 81 mg chewable tablet Chew 81 mg by mouth daily. Take with food.     ? calcium carbonate/vitamin D-3 (OSCAL-500+D) 1250 mg/200 unit tablet Take 1 tablet by mouth daily. At noon, Calcium Carb 1250mg  delivers 500mg  elemental Ca     ? cetirizine (ZYRTEC) 10 mg tablet Take 10 mg by mouth daily.     ? citalopram (CELEXA) 20 mg tablet Take one tablet by mouth daily. 30 tablet 3   ? docusate (COLACE) 100 mg capsule Take 100 mg by mouth twice daily.     ? ergocalciferol (VITAMIN D-2) 50,000 unit capsule Take 1 capsule by mouth every 7 days.     ? fluticasone propionate (FLONASE) 50 mcg/actuation nasal spray Apply  to each nostril as directed daily. Shake bottle gently before using.     ? HYDROcodone/acetaminophen (NORCO) 5/325 mg tablet Take 1 tablet by mouth every 6 hours as needed       ?  ibuprofen (MOTRIN) 800 mg tablet Take 800 mg by mouth as Needed for Pain. Take with food.     ? levothyroxine (SYNTHROID) 50 mcg tablet Take one tablet by mouth daily 30 minutes before breakfast. 90 tablet 3   ? loperamide (IMODIUM A-D) 2 mg capsule Take 2-4 mg by mouth as Needed.     ? metoprolol XL (TOPROL XL) 50 mg extended release tablet TAKE 1 TABLET TWICE DAILY 180 tablet 3   ? montelukast (SINGULAIR) 10 mg tablet Take 10 mg by mouth at bedtime daily.     ? MV with Min-Lycopene-Lutein 0.4 mg-300 mcg- 250 mcg tab Take 1 tablet by mouth daily. At noon     ? pantoprazole DR (PROTONIX) 40 mg tablet Take 40 mg by mouth twice daily.     ? sulfADIAZINE 500 mg tablet Take 500 mg by mouth three times daily.     ? sulfADIAZINE 500 mg tablet Take 1,000 mg by mouth three times daily.     ? sulfaSALAzine (AZULFIDINE) 500 mg tablet sulfasalazine 500 mg tablet     ? Vit B Cmplx 3-FA-Vit C-Biotin 1-60-300 mg-mg-mcg tab Take 1 tablet by mouth daily.     ? vitamins, B complex tab Take 1 tablet by mouth daily. At noon       No current facility-administered medications on file prior to visit.         family history includes Cancer in her maternal grandfather and mother; Dementia in her paternal grandfather; Diabetes in her mother; Heart Disease in her father; Hypertension in her mother; Migraines in her sister; Seizures in her daughter, maternal grandfather, and son; Stroke in her father and paternal grandmother.      Social History     Socioeconomic History   ? Marital status: Married   Tobacco Use   ? Smoking status: Passive Smoke Exposure - Never Smoker   ? Smokeless tobacco: Never Used   Substance and Sexual Activity   ? Alcohol use: No   ? Drug use: No Review of Systems   Musculoskeletal: Positive for back pain and neck pain.         PHYSICAL EXAM:    Vitals:    02/12/21 1423   Pulse: 66   SpO2: 97%   PainSc: Seven   Weight: 104.3 kg (230 lb)   Height: 165.1 cm (5' 5)     Oswestry Total Score:: 52  Pain Score: Seven  Body mass index is 38.27 kg/m?Marland Kitchen    General: Alert, cooperative, no acute distress.  HEENT: Normocephalic, atraumatic.  Neck: Supple.  Lungs: Unlabored respirations, bilateral and equal chest excursion.  Heart: Regular rate.  Skin: Warm and dry to touch.  Abdomen: Nondistended.    Cervical spine:  Cervical tenderness: No  Pain with extension: No  Pain with lateral flexion: None  Limited neck ROM: No  Sensation to light touch - left upper extremity: intact  Sensation to light touch - right upper extremity: intact  Hoffman sign: Negative bilaterally    Upper extremity strength:   Root Right Left   Shoulder Abduction C5 5 5   Elbow Flexion C5 5 5   Elbow Extension C7 5 5   Wrist Extension C6 5 5   Finger Flexion C8 5 5   Finger Abduction T1 5 5     Upper extremity reflexes:   Right Left   Biceps 2/4 2/4   Triceps 2/4 2/4   Brachioradialis 2/4 2/4       Lumbar spine:  Appearance: No lesions or deformity, well-healed mid-line incision.  IPG Right Buttock  Lumbar tenderness: No  SI joint tenderness: Negative bilaterally  Pain with extension: No  Pain with lateral flexion: None  Sensation to light touch - left lower extremity: intact  Sensation to light touch - right lower extremity: intact  Straight leg raise: Negative bilaterally  FABER test: Negative bilaterally  FADIR test: Negative bilaterally  Gaenslen test: Deferred    Lower extremity strength:   Root Right Left   Hip Flexion L2 5 5   Knee Flexion L5/S1 5 5   Knee Extension L3 5 5   Dorsiflexion L4 5 5   Plantarflexion S1 5 5   EHL Extension L5 5 5     Lower extremity reflexes:   Right Left   Patellar 2/4 2/4   Achilles 2/4 2/4       Neurological: Alert and oriented x3.      RADIOGRAPHIC EVALUATION:    MRI C-Spine Results:    Results for orders placed during the hospital encounter of 05/23/15    MRI C-SPINE WO CONTRAST    Narrative  MRI C-SPINE    CLINICAL HISTORY:  75 year old female, fall.    TECHNIQUE:  Multiple sagittal and axial MR sequences were obtained of the cervical spine without intravenous contrast.    COMPARISON:  CT of the cervical spine from May 23, 2015.    FINDINGS:    There is persistent mild straightening of the normal cervical lordosis. The vertebral body heights are maintained. There is normal marrow signal.  The cervical cord is normal in size and signal. There is moderate cervical spondylosis and degenerative disc disease, greatest at C5-C6. Subcutaneous edema is visualized within the soft tissues of the high neck at the level of C2-C3. The paraspinous tissues are otherwise unremarkable.    At C2-C3, uncovertebral and facet hypertrophy result in no significant central spinal or neural foraminal narrowing.    At C3-C4, uncovertebral and facet hypertrophy result in no significant central spinal or neural foraminal narrowing.    At C4-C5, mild broad-based posterior disc bulge, as well as uncovertebral and facet hypertrophy results in no significant central spinal or neural foraminal narrowing.    At C5-C6, degenerative disc disease, posterior disc osteophyte complex, as well as facet and uncovertebral hypertrophy result in mild central spinal stenosis, as well as marked left and mild right neural foraminal narrowing.    At C6-C7, uncovertebral and facet hypertrophy resulting in no significant central spinal stenosis, and mild left neural foraminal narrowing.    At C7-T1, there is no significant central spinal or neural foraminal narrowing.    Impression  1.  No acute cervical spine fracture or subluxation.  2.  Subcutaneous edema within the soft tissues of the upper neck at the level of C2-C3.  3.  Moderate cervical spondylosis and degenerative disc disease, greatest at C5-C6 where there is mild central spinal stenosis and marked left neural foraminal narrowing.    By my electronic signature, I attest that I have personally reviewed the images for this examination and formulated the interpretations and opinions expressed in this report      Finalized by Myles Lipps, M.D. on 05/24/2015 7:03 AM. Dictated by Denton Lank, M.D. on 05/24/2015 6:09 AM.       Results for orders placed during the hospital encounter of 11/17/13    MRI L-SPINE WO/W CONTRAST    Narrative  EXAM:    EXAMINATION: MAGNETIC RESONANCE IMAGING (MRI) OF THE LUMBAR  SPINE WITHOUT  AND WITH CONTRAST    HISTORY: 75 year old female with low back pain and pain radiating into the  right leg.    TECHNIQUE: MRI of the lumbar spine was performed prior to and following  the uneventful administration of intravenous gadolinium contrast  according to standard protocol.    FINDINGS: Radiographs of the lumbar spine dated 10/06/2013.    The alignment is normal. Vertebral bodies are normal in height without  evidence of compression fractures. Postoperative changes of right L5  laminotomy are noted. Other than type II Modic endplate changes at L5-S1,  the marrow signal intensity is normal. The conus medullaris terminates at  the level of L1 and the distal spinal cord signal intensity is normal.  There is moderate disc desiccation and height loss at L5-S1. Bilateral  extra renal pelvis is noted. There is no abnormal enhancement.    L1-L2: There is no disc bulge. There is no spinal canal sten  osis. There  is no facet osteoarthritis. There is no neural foraminal stenosis.    L2-L3: There is no disc bulge. There is no spinal canal stenosis. There  is no facet osteoarthritis. There is no neural foraminal stenosis.    L3-L4: There is no disc bulge. There is no spinal canal stenosis. There  is mild bilateral facet osteoarthritis. There is no neural foraminal  stenosis.    L4-L5: There is no disc bulge. There is no spinal canal stenosis. There  is mild bilateral facet osteoarthritis. There is no neural foraminal  stenosis.    L5-S1: There is a diffuse disc bulge. There is no spinal canal stenosis.  There is mild bilateral facet osteoarthritis. There is moderate bilateral  neural foraminal stenosis.    Impression  MRI L-SPINE WO/W CONTRAST       IMPRESSION:    1. 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 NO SPINAL CANAL STENOSIS.    Residents: FELLOW: Darden Amber  M.D.  Electronically signed on: Nov 17 2013  4:12PM by Shanna Cisco M.D.      By my electronic signature, I attest that I have personally reviewed the  images for this examination and formulated the interpretations and  opinions expressed in this report.    MRI L Spine 06/2020  Chronic L1 Compression frx  Lumbar spondylosis w/o central stenosis  Severe right L4-5 and Severe bilateral L5-S1 neural foraminal narrowing           IMPRESSION:    1. Fibromyalgia    2. Chronic pain syndrome    3. Spinal cord stimulator status          PLAN:   TAIWANNA BRUGH has diffuse pain, headaches, and fatigue most likely related to fibromyalgia.  She has had several interventional procedures including a spinal cord stimulator without much benefit.  She was asking about an intrathecal pump today, but I do not think this will be effective for her widespread pain.  More injections are also not likely to help.  We discussed general treatment of fibromyalgia, which includes medications, pain psychology, and physical therapy.  She has tried several medications and does not tolerate these very well.  I am referring her for aquatic therapy, which she believes she can do in [place].  I also recommended referral to a pain psychologist.  She is going to think about this.  I did place referral today, and  she can call and schedule this at any time if she becomes interested.  I do not take over prescribing of chronic opioid medications, nor do I recommend this for the treatment of pain related to fibromyalgia.  She may follow-up with me as needed.

## 2021-02-12 NOTE — Patient Instructions
It was nice to see you today. Thank you for choosing to visit our clinic.       Your time is important and if you had to wait today, we do apologize. Our goal is to run exactly on time; however, on occasion, we get behind in clinic due to unexpected patient issues. Thank you for your patience.    General Instructions:  How to reach me: Please send a MyChart message to the [facility] or leave a voicemail for my nurse, George at [phone]  How to get a medication refill: Please use the MyChart Refill request or contact your pharmacy directly to request medication refills. We do not do same day refills on controlled substances.  How to receive your test results: If you have signed up for MyChart, you will receive your test results and messages from me this way. Otherwise, you will get a phone call or letter. If you are expecting results and have not heard from my office within 2 weeks of your testing, please send a MyChart message or call my office.  Scheduling: Our scheduling phone number is [phone].  Appointment Reminders on your cell phone: Communication preferences can be managed in MyChart to ensure you receive important appointment notifications.  Support for many chronic illnesses is available through [facility]: [facility] or [phone].  For questions on nights, weekends or holidays, call the Operator at [phone], and ask for the doctor on call for Anesthesia Pain.      Again, thank you for coming in today.    _________________________________________________________________________________________________________

## 2021-02-27 ENCOUNTER — Encounter: Admit: 2021-02-27 | Discharge: 2021-02-27 | Payer: MEDICARE

## 2021-02-28 ENCOUNTER — Encounter: Admit: 2021-02-28 | Discharge: 2021-02-28 | Payer: MEDICARE

## 2021-02-28 DIAGNOSIS — S02119A Unspecified fracture of occiput, initial encounter for closed fracture: Secondary | ICD-10-CM

## 2021-02-28 DIAGNOSIS — S069XAA Traumatic brain injury: Secondary | ICD-10-CM

## 2021-02-28 DIAGNOSIS — G43909 Migraine, unspecified, not intractable, without status migrainosus: Secondary | ICD-10-CM

## 2021-02-28 DIAGNOSIS — I10 Essential (primary) hypertension: Secondary | ICD-10-CM

## 2021-02-28 DIAGNOSIS — M797 Fibromyalgia: Secondary | ICD-10-CM

## 2021-02-28 DIAGNOSIS — R0989 Other specified symptoms and signs involving the circulatory and respiratory systems: Secondary | ICD-10-CM

## 2021-02-28 DIAGNOSIS — R002 Palpitations: Secondary | ICD-10-CM

## 2021-02-28 DIAGNOSIS — Z9889 Other specified postprocedural states: Secondary | ICD-10-CM

## 2021-02-28 DIAGNOSIS — G8929 Other chronic pain: Secondary | ICD-10-CM

## 2021-02-28 DIAGNOSIS — R413 Other amnesia: Secondary | ICD-10-CM

## 2021-02-28 DIAGNOSIS — U071 COVID-19 virus infection: Secondary | ICD-10-CM

## 2021-02-28 DIAGNOSIS — M810 Age-related osteoporosis without current pathological fracture: Secondary | ICD-10-CM

## 2021-02-28 DIAGNOSIS — M1990 Unspecified osteoarthritis, unspecified site: Secondary | ICD-10-CM

## 2021-02-28 DIAGNOSIS — G2581 Restless legs syndrome: Secondary | ICD-10-CM

## 2021-02-28 DIAGNOSIS — E7800 Pure hypercholesterolemia, unspecified: Secondary | ICD-10-CM

## 2021-02-28 DIAGNOSIS — E785 Hyperlipidemia, unspecified: Secondary | ICD-10-CM

## 2021-02-28 DIAGNOSIS — H269 Unspecified cataract: Secondary | ICD-10-CM

## 2021-02-28 DIAGNOSIS — J45909 Unspecified asthma, uncomplicated: Secondary | ICD-10-CM

## 2021-02-28 DIAGNOSIS — R32 Unspecified urinary incontinence: Secondary | ICD-10-CM

## 2021-02-28 DIAGNOSIS — S065XAA Subdural hematoma: Secondary | ICD-10-CM

## 2021-02-28 DIAGNOSIS — K5090 Crohn's disease, unspecified, without complications: Secondary | ICD-10-CM

## 2021-02-28 DIAGNOSIS — I471 Supraventricular tachycardia: Secondary | ICD-10-CM

## 2021-02-28 DIAGNOSIS — K50919 Crohn's disease, unspecified, with unspecified complications: Secondary | ICD-10-CM

## 2021-02-28 NOTE — Patient Instructions
Thank you for visiting our office today.    Continue the same medications as you have been doing.          We will be pursuing the following tests after your appointment today:       Orders Placed This Encounter    ECG 12-LEAD     Referral to Neuro for back pain.    Please call us in the meantime with any questions or concerns.        Please allow 5-7 business days for our providers to review your results. All normal results will go to MyChart. If you do not have Mychart, it is strongly recommended to get this so you can easily view all your results. If you do not have mychart, we will attempt to call you once with normal lab and testing results. If we cannot reach you by phone with normal results, we will send you a letter.  If you have not heard the results of your testing after one week please give [place] a call.       Your Cardiovascular Medicine [place] Team Brett Canales, Pilar Jarvis and [place])  phone number is [phone].

## 2021-05-31 ENCOUNTER — Encounter: Admit: 2021-05-31 | Discharge: 2021-05-31 | Payer: MEDICARE

## 2021-05-31 DIAGNOSIS — M549 Dorsalgia, unspecified: Secondary | ICD-10-CM

## 2021-06-08 ENCOUNTER — Encounter: Admit: 2021-06-08 | Discharge: 2021-06-08 | Payer: MEDICARE

## 2021-06-08 NOTE — Telephone Encounter
Visit Type: New Patient  Outside Records: Yes  Images Available: No  MyChart Message Sent: No  Additional Comments:      Confirm appt   Ask to arrive 30 min early for xray.   Questionnaire done with Dr. Evelena Leyden 02/11/21   Patient have recent imaging on a disk drive/ USB. Ask patient see if they can get imaging on a disc.     [place]   [facility] -report in chart   L-MRI 07/26/20   L-CT 07/23/20

## 2021-06-11 ENCOUNTER — Ambulatory Visit: Admit: 2021-06-11 | Discharge: 2021-06-11 | Payer: MEDICARE

## 2021-06-11 ENCOUNTER — Encounter: Admit: 2021-06-11 | Discharge: 2021-06-11 | Payer: MEDICARE

## 2021-06-11 DIAGNOSIS — S069XAA Traumatic brain injury: Secondary | ICD-10-CM

## 2021-06-11 DIAGNOSIS — H269 Unspecified cataract: Secondary | ICD-10-CM

## 2021-06-11 DIAGNOSIS — R413 Other amnesia: Secondary | ICD-10-CM

## 2021-06-11 DIAGNOSIS — J45909 Unspecified asthma, uncomplicated: Secondary | ICD-10-CM

## 2021-06-11 DIAGNOSIS — I10 Essential (primary) hypertension: Secondary | ICD-10-CM

## 2021-06-11 DIAGNOSIS — M549 Dorsalgia, unspecified: Secondary | ICD-10-CM

## 2021-06-11 DIAGNOSIS — M810 Age-related osteoporosis without current pathological fracture: Secondary | ICD-10-CM

## 2021-06-11 DIAGNOSIS — Z9889 Other specified postprocedural states: Secondary | ICD-10-CM

## 2021-06-11 DIAGNOSIS — R32 Unspecified urinary incontinence: Secondary | ICD-10-CM

## 2021-06-11 DIAGNOSIS — M1990 Unspecified osteoarthritis, unspecified site: Secondary | ICD-10-CM

## 2021-06-11 DIAGNOSIS — E785 Hyperlipidemia, unspecified: Secondary | ICD-10-CM

## 2021-06-11 DIAGNOSIS — R002 Palpitations: Secondary | ICD-10-CM

## 2021-06-11 DIAGNOSIS — I471 Supraventricular tachycardia: Secondary | ICD-10-CM

## 2021-06-11 DIAGNOSIS — G8929 Other chronic pain: Secondary | ICD-10-CM

## 2021-06-11 DIAGNOSIS — G43909 Migraine, unspecified, not intractable, without status migrainosus: Secondary | ICD-10-CM

## 2021-06-11 DIAGNOSIS — G2581 Restless legs syndrome: Secondary | ICD-10-CM

## 2021-06-11 DIAGNOSIS — K5090 Crohn's disease, unspecified, without complications: Secondary | ICD-10-CM

## 2021-06-11 DIAGNOSIS — M797 Fibromyalgia: Secondary | ICD-10-CM

## 2021-08-10 ENCOUNTER — Encounter: Admit: 2021-08-10 | Discharge: 2021-08-10 | Payer: MEDICARE

## 2021-08-10 MED ORDER — METOPROLOL SUCCINATE 50 MG PO TB24
ORAL_TABLET | 3 refills
Start: 2021-08-10 — End: ?

## 2021-08-21 ENCOUNTER — Encounter: Admit: 2021-08-21 | Discharge: 2021-08-21 | Payer: MEDICARE

## 2021-10-18 ENCOUNTER — Encounter: Admit: 2021-10-18 | Discharge: 2021-10-18 | Payer: MEDICARE

## 2021-10-22 ENCOUNTER — Encounter: Admit: 2021-10-22 | Discharge: 2021-10-22 | Payer: MEDICARE

## 2021-10-22 ENCOUNTER — Ambulatory Visit: Admit: 2021-10-22 | Discharge: 2021-10-23 | Payer: MEDICARE

## 2021-10-22 DIAGNOSIS — R32 Unspecified urinary incontinence: Secondary | ICD-10-CM

## 2021-10-22 DIAGNOSIS — R413 Other amnesia: Secondary | ICD-10-CM

## 2021-10-22 DIAGNOSIS — G2581 Restless legs syndrome: Secondary | ICD-10-CM

## 2021-10-22 DIAGNOSIS — M797 Fibromyalgia: Secondary | ICD-10-CM

## 2021-10-22 DIAGNOSIS — E785 Hyperlipidemia, unspecified: Secondary | ICD-10-CM

## 2021-10-22 DIAGNOSIS — J45909 Unspecified asthma, uncomplicated: Secondary | ICD-10-CM

## 2021-10-22 DIAGNOSIS — G43909 Migraine, unspecified, not intractable, without status migrainosus: Secondary | ICD-10-CM

## 2021-10-22 DIAGNOSIS — M810 Age-related osteoporosis without current pathological fracture: Secondary | ICD-10-CM

## 2021-10-22 DIAGNOSIS — I471 Supraventricular tachycardia: Secondary | ICD-10-CM

## 2021-10-22 DIAGNOSIS — I10 Essential (primary) hypertension: Secondary | ICD-10-CM

## 2021-10-22 DIAGNOSIS — M1990 Unspecified osteoarthritis, unspecified site: Secondary | ICD-10-CM

## 2021-10-22 DIAGNOSIS — R002 Palpitations: Secondary | ICD-10-CM

## 2021-10-22 DIAGNOSIS — K5090 Crohn's disease, unspecified, without complications: Secondary | ICD-10-CM

## 2021-10-22 DIAGNOSIS — Z9889 Other specified postprocedural states: Secondary | ICD-10-CM

## 2021-10-22 DIAGNOSIS — S069XAA Traumatic brain injury (HCC): Secondary | ICD-10-CM

## 2021-10-22 DIAGNOSIS — G8929 Other chronic pain: Secondary | ICD-10-CM

## 2021-10-22 DIAGNOSIS — H269 Unspecified cataract: Secondary | ICD-10-CM

## 2021-10-23 DIAGNOSIS — N3941 Urge incontinence: Secondary | ICD-10-CM

## 2021-11-26 ENCOUNTER — Encounter: Admit: 2021-11-26 | Discharge: 2021-11-26 | Payer: MEDICARE

## 2021-11-26 ENCOUNTER — Ambulatory Visit: Admit: 2021-11-26 | Discharge: 2021-11-26 | Payer: MEDICARE

## 2021-11-26 DIAGNOSIS — I471 Supraventricular tachycardia: Secondary | ICD-10-CM

## 2021-11-26 DIAGNOSIS — G2581 Restless legs syndrome: Secondary | ICD-10-CM

## 2021-11-26 DIAGNOSIS — I10 Essential (primary) hypertension: Secondary | ICD-10-CM

## 2021-11-26 DIAGNOSIS — K5090 Crohn's disease, unspecified, without complications: Secondary | ICD-10-CM

## 2021-11-26 DIAGNOSIS — M810 Age-related osteoporosis without current pathological fracture: Secondary | ICD-10-CM

## 2021-11-26 DIAGNOSIS — R399 Unspecified symptoms and signs involving the genitourinary system: Secondary | ICD-10-CM

## 2021-11-26 DIAGNOSIS — R32 Unspecified urinary incontinence: Secondary | ICD-10-CM

## 2021-11-26 DIAGNOSIS — R002 Palpitations: Secondary | ICD-10-CM

## 2021-11-26 DIAGNOSIS — Z9889 Other specified postprocedural states: Secondary | ICD-10-CM

## 2021-11-26 DIAGNOSIS — M1990 Unspecified osteoarthritis, unspecified site: Secondary | ICD-10-CM

## 2021-11-26 DIAGNOSIS — E785 Hyperlipidemia, unspecified: Secondary | ICD-10-CM

## 2021-11-26 DIAGNOSIS — M797 Fibromyalgia: Secondary | ICD-10-CM

## 2021-11-26 DIAGNOSIS — G8929 Other chronic pain: Secondary | ICD-10-CM

## 2021-11-26 DIAGNOSIS — S069XAA Traumatic brain injury (HCC): Secondary | ICD-10-CM

## 2021-11-26 DIAGNOSIS — J45909 Unspecified asthma, uncomplicated: Secondary | ICD-10-CM

## 2021-11-26 DIAGNOSIS — G43909 Migraine, unspecified, not intractable, without status migrainosus: Secondary | ICD-10-CM

## 2021-11-26 DIAGNOSIS — N3941 Urge incontinence: Secondary | ICD-10-CM

## 2021-11-26 DIAGNOSIS — H269 Unspecified cataract: Secondary | ICD-10-CM

## 2021-11-26 DIAGNOSIS — R413 Other amnesia: Secondary | ICD-10-CM

## 2021-11-26 MED ORDER — SULFAMETHOXAZOLE-TRIMETHOPRIM 800-160 MG PO TAB
1 | Freq: Two times a day (BID) | ORAL | 0 refills | Status: CP
Start: 2021-11-26 — End: ?
  Administered 2021-11-26 (×2): 1 via ORAL

## 2021-11-26 MED ORDER — SULFAMETHOXAZOLE-TRIMETHOPRIM 800-160 MG PO TAB
1 | ORAL_TABLET | Freq: Two times a day (BID) | ORAL | 0 refills | Status: AC
Start: 2021-11-26 — End: ?

## 2021-11-26 NOTE — Progress Notes
Pt presented to clinic for urodynamic study and cystoscopy. Timeout performed with pt. Procedures explained and consent singed. Prior to URD, pt UA positive for blood, leukocytes, and nitrites. Pt reports burning and pain with urination. Pt urine also amber and cloudy. Spoke with Dr. Arlean Hopping who advised that we reschedule URD/cysto to 11/29/21 at 0830. Dr. Arlean Hopping also advised to provide pt with two doses of Bactrim in clinic and send Bactirm x7 days BID to pharmacy. Advised pt to let us know if her symptoms do not improve by Friday as we may have to reschedule again. Pt states understanding. Urine sent for culture. Pt escorted from clinic in stable condition.

## 2021-11-30 ENCOUNTER — Encounter: Admit: 2021-11-30 | Discharge: 2021-11-30 | Payer: MEDICARE

## 2021-11-30 MED ORDER — LEVOFLOXACIN 250 MG PO TAB
250 mg | ORAL_TABLET | Freq: Every day | ORAL | 0 refills | 7.00000 days | Status: DC
Start: 2021-11-30 — End: 2021-11-30

## 2021-11-30 MED ORDER — LEVOFLOXACIN 250 MG PO TAB
250 mg | ORAL_TABLET | Freq: Every day | ORAL | 0 refills | 7.00000 days | Status: AC
Start: 2021-11-30 — End: ?

## 2021-11-30 MED ORDER — PHENAZOPYRIDINE 200 MG PO TAB
200 mg | ORAL_TABLET | Freq: Three times a day (TID) | ORAL | 0 refills | Status: DC | PRN
Start: 2021-11-30 — End: 2021-11-30

## 2021-11-30 MED ORDER — PHENAZOPYRIDINE 200 MG PO TAB
200 mg | ORAL_TABLET | Freq: Three times a day (TID) | ORAL | 0 refills | Status: AC | PRN
Start: 2021-11-30 — End: ?

## 2021-11-30 NOTE — Telephone Encounter
Reports pharmacy which prior prescriptions were sent to is closed. Prefer to be sent to [facility] in [place], [place]. Sending levaquin x 7 days and pyridium to this location based on prior discussion with Dr. Arlean Hopping

## 2021-12-13 ENCOUNTER — Ambulatory Visit: Admit: 2021-12-13 | Discharge: 2021-12-13 | Payer: MEDICARE

## 2021-12-13 ENCOUNTER — Encounter: Admit: 2021-12-13 | Discharge: 2021-12-13 | Payer: MEDICARE

## 2021-12-13 DIAGNOSIS — N3941 Urge incontinence: Secondary | ICD-10-CM

## 2021-12-13 MED ORDER — ESTRADIOL 0.01 % (0.1 MG/GRAM) VA CREA
11 refills | 30.00000 days | Status: AC
Start: 2021-12-13 — End: ?

## 2021-12-13 MED ORDER — NITROFURANTOIN MONOHYD/M-CRYST 100 MG PO CAP
100 mg | ORAL_CAPSULE | Freq: Every day | ORAL | 0 refills | 7.00000 days | Status: AC
Start: 2021-12-13 — End: ?

## 2021-12-13 MED ORDER — CIPROFLOXACIN HCL 500 MG PO TAB
500 mg | Freq: Once | ORAL | 0 refills | Status: CP
Start: 2021-12-13 — End: ?
  Administered 2021-12-13: 17:00:00 500 mg via ORAL

## 2021-12-13 NOTE — Progress Notes
Patient presented to clinic for a urodynamic study and cystoscopy. Timeout completed with patient. Procedures were explained and consent was signed. This RN performed URD. Patient tolerated urodynamic study well. Dr. Presley Raddle was present for the cystoscopy, patient tolerated well. Dr. Presley Raddle reviewed results with patient. Patient left clinic in stable condition.

## 2021-12-13 NOTE — Procedures
Office Cystoscopy    Antibiotic prophylaxis was given. After sterile prep and drape, and signing informed consent, 1% lidocaine was placed intraurethrally.      Flexible cystoscopy was then carried out.  Within the bladder, no tumors, diverticula, or stones noted.  Ureteral orifices noted to be in orthotopic position. Retroflex view of bladder neck normal.  Urethra was normal.   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  UA was negative.                      P: Will try a course of 3 month course of low dose antibiotic.  Start estrogen cream

## 2021-12-20 ENCOUNTER — Encounter: Admit: 2021-12-20 | Discharge: 2021-12-20 | Payer: MEDICARE

## 2021-12-20 MED ORDER — CEPHALEXIN 500 MG PO CAP
500 mg | ORAL_CAPSULE | Freq: Every day | ORAL | 0 refills | Status: AC
Start: 2021-12-20 — End: ?

## 2021-12-23 ENCOUNTER — Encounter: Admit: 2021-12-23 | Discharge: 2021-12-23 | Payer: MEDICARE

## 2022-01-26 ENCOUNTER — Encounter: Admit: 2022-01-26 | Discharge: 2022-01-26 | Payer: MEDICARE

## 2022-03-19 ENCOUNTER — Encounter: Admit: 2022-03-19 | Discharge: 2022-03-19 | Payer: MEDICARE

## 2022-03-19 NOTE — Telephone Encounter
LVM that she was almost out of her antibiotics and wondered if they could be reordered. Returned call after reviewing chart which indicated Dr. Arlean Hopping was planning a 90 day course of low-dose antibiotic and patient has a return visit on 04/08/22. Explained to patient that Dr. Arlean Hopping would want to see how the urine looked after the course had been taken and she was off for a few weeks. Patient verbalized understanding. Time of appointment confirmed with patient.

## 2022-04-07 ENCOUNTER — Encounter: Admit: 2022-04-07 | Discharge: 2022-04-07 | Payer: MEDICARE

## 2022-04-18 ENCOUNTER — Encounter: Admit: 2022-04-18 | Discharge: 2022-04-18 | Payer: MEDICARE

## 2022-05-08 ENCOUNTER — Encounter: Admit: 2022-05-08 | Discharge: 2022-05-08 | Payer: MEDICARE

## 2022-05-08 DIAGNOSIS — R0989 Other specified symptoms and signs involving the circulatory and respiratory systems: Secondary | ICD-10-CM

## 2022-05-08 DIAGNOSIS — S06331A Contusion and laceration of cerebrum, unspecified, with loss of consciousness of 30 minutes or less, initial encounter: Secondary | ICD-10-CM

## 2022-05-08 DIAGNOSIS — M810 Age-related osteoporosis without current pathological fracture: Secondary | ICD-10-CM

## 2022-05-08 DIAGNOSIS — R32 Unspecified urinary incontinence: Secondary | ICD-10-CM

## 2022-05-08 DIAGNOSIS — I471 SVT (supraventricular tachycardia): Secondary | ICD-10-CM

## 2022-05-08 DIAGNOSIS — H269 Unspecified cataract: Secondary | ICD-10-CM

## 2022-05-08 DIAGNOSIS — S065XAA Subdural hematoma (HCC): Secondary | ICD-10-CM

## 2022-05-08 DIAGNOSIS — U071 COVID-19 virus infection: Secondary | ICD-10-CM

## 2022-05-08 DIAGNOSIS — Z9889 Other specified postprocedural states: Secondary | ICD-10-CM

## 2022-05-08 DIAGNOSIS — R002 Palpitations: Secondary | ICD-10-CM

## 2022-05-08 DIAGNOSIS — E7800 Pure hypercholesterolemia, unspecified: Secondary | ICD-10-CM

## 2022-05-08 DIAGNOSIS — I10 Essential (primary) hypertension: Secondary | ICD-10-CM

## 2022-05-08 DIAGNOSIS — M1990 Unspecified osteoarthritis, unspecified site: Secondary | ICD-10-CM

## 2022-05-08 DIAGNOSIS — M797 Fibromyalgia: Secondary | ICD-10-CM

## 2022-05-08 DIAGNOSIS — K5090 Crohn's disease, unspecified, without complications: Secondary | ICD-10-CM

## 2022-05-08 DIAGNOSIS — J45909 Unspecified asthma, uncomplicated: Secondary | ICD-10-CM

## 2022-05-08 DIAGNOSIS — G43911 Migraine, unspecified, intractable, with status migrainosus: Secondary | ICD-10-CM

## 2022-05-08 DIAGNOSIS — E785 Hyperlipidemia, unspecified: Secondary | ICD-10-CM

## 2022-05-08 DIAGNOSIS — G8929 Other chronic pain: Secondary | ICD-10-CM

## 2022-05-08 DIAGNOSIS — S069XAA Traumatic brain injury (HCC): Secondary | ICD-10-CM

## 2022-05-08 DIAGNOSIS — G2581 Restless legs syndrome: Secondary | ICD-10-CM

## 2022-05-08 DIAGNOSIS — G936 Cerebral edema: Secondary | ICD-10-CM

## 2022-05-08 DIAGNOSIS — K50919 Crohn's disease, unspecified, with unspecified complications: Secondary | ICD-10-CM

## 2022-05-08 DIAGNOSIS — G43909 Migraine, unspecified, not intractable, without status migrainosus: Secondary | ICD-10-CM

## 2022-05-08 DIAGNOSIS — E6609 Other obesity due to excess calories: Secondary | ICD-10-CM

## 2022-05-08 DIAGNOSIS — R413 Other amnesia: Secondary | ICD-10-CM

## 2022-05-08 DIAGNOSIS — S02119A Unspecified fracture of occiput, initial encounter for closed fracture: Secondary | ICD-10-CM

## 2022-05-08 DIAGNOSIS — R0789 Other chest pain: Secondary | ICD-10-CM

## 2022-05-08 MED ORDER — METOPROLOL SUCCINATE 50 MG PO TB24
50 mg | ORAL_TABLET | Freq: Two times a day (BID) | ORAL | 3 refills | 90.00000 days | Status: AC
Start: 2022-05-08 — End: ?

## 2022-05-08 NOTE — Progress Notes
Date of Service: 05/08/2022    Jeanette Bush is a 77 y.o. female.       HPI      Jeanette Bush is a 77 y.o. white female with a history of hypertension, hyperlipidemia, symptomatic heart palpitations, brief episodes of atrial fibrillation and SVT previously detected on event monitor performed in March 2011, symptoms have been very well-contained with beta-blocker without any significant recurrence, morbid obesity, current BMI 43.7 kg/m?, fibromyalgia, history of previous laminectomy, history of fall that resulted in bilateral subdural hematoma (this occurred in February - March 2017), chronic low back pain, currently wheelchair dependent, status post spinal cord stimulator, history of mechanical fall on Valentine's Day 2023.    Patient does not report having any symptoms or chest pain and no heart palpitations.  She remained hemodynamically stable.  She does not have any documented history of obstructive CAD.    She has been experiencing significant low back pain and does not have any great options to surgical intervention.  She has been at [facility] pain management center, it is possible that she will undergo a pain pump implant.       Vitals:    05/08/22 1444   BP: (!) 148/70   BP Source: Arm, Left Lower   Pulse: 68   SpO2: 92%   O2 Device: None (Room air)   PainSc: Eight   Weight: 119.1 kg (262 lb 9.6 oz)   Height: 165.1 cm (5' 5)     Body mass index is 43.7 kg/m?Marland Kitchen     Past Medical History  Patient Active Problem List    Diagnosis Date Noted    COVID-19 virus infection 02/28/2021    History of lumbar laminectomy 02/28/2021    COVID-19 virus infection 02/28/2021    Class 2 obesity due to excess calories in adult 01/01/2016    Cervical spine pain 05/24/2015    Cerebral edema (HCC) 05/24/2015    Intraparenchymal hematoma of brain (HCC) 05/24/2015    Closed fracture of occipital bone (HCC) 05/24/2015    Subdural hematoma (HCC) 05/23/2015    Altered mental status 05/23/2015    Fall 05/23/2015    Heart palpitations 07/21/2013    Pre-operative cardiovascular examination 06/25/2011    Obesity, Class III, BMI 40-49.9 (morbid obesity) (HCC) 06/25/2011    Atypical chest pain 09/13/2010    Morbidly obese (HCC) 09/13/2010    Left axis deviation 09/13/2010    Hyperlipidemia 06/12/2010    SVT (supraventricular tachycardia) (HCC) 01/11/2010    Palpitations 01/11/2010    Crohn's disease (HCC) 06/14/2009    Fibromyalgia 06/14/2009    RLS (restless legs syndrome) 06/14/2009    Asthma 06/14/2009    HTN (hypertension) 06/14/2009    Osteoporosis 06/14/2009    Migraine 06/14/2009    Chronic bilateral low back pain without sciatica 06/14/2009    Urinary incontinence 06/14/2009         Review of Systems   Constitutional: Positive for malaise/fatigue.   HENT: Negative.     Eyes: Negative.    Cardiovascular: Negative.    Respiratory: Negative.     Endocrine: Negative.    Hematologic/Lymphatic: Negative.    Skin: Negative.    Musculoskeletal:  Positive for back pain, muscle cramps, muscle weakness and myalgias.   Gastrointestinal: Negative.    Genitourinary: Negative.    Neurological:  Positive for dizziness, headaches, light-headedness, numbness, paresthesias and weakness.   Psychiatric/Behavioral: Negative.     Allergic/Immunologic: Negative.        Physical Exam  General Appearance: obese, BMI= kg/m?.  using a wheelchair   Skin: warm, moist, no ulcers or xanthomas  Eyes: conjunctivae and lids normal, pupils are equal and round  Lips & Oral Mucosa: no pallor or cyanosis  Neck Veins: neck veins are flat, neck veins are not distended  Chest Inspection: chest is normal in appearance  Respiratory Effort: breathing comfortably, no respiratory distress  Auscultation/Percussion: lungs clear to auscultation, no rales or rhonchi, no wheezing  Cardiac Rhythm: regular rhythm and normal rate  Cardiac Auscultation: S1, S2 normal, no rub, no gallop  Murmurs: no murmur  Carotid Arteries: normal carotid upstroke bilaterally, no bruit  Abdominal aorta: could not be examined due to obese adomen  Lower Extremity Edema: no lower extremity edema  Abdominal Exam: soft, non-tender, no masses, bowel sounds normal  Liver & Spleen: no organomegaly  Language and Memory: patient responsive and seems to comprehend information  Neurologic Exam: neurological assessment grossly intact      Cardiovascular Studies  Twelve-lead EKG demonstrates normal sinus rhythm, no ST segment T wave changes, ventricular rate 66 bpm, no axis deviation.    Cardiovascular Health Factors  Vitals BP Readings from Last 3 Encounters:   05/08/22 (!) 148/70   11/26/21 (!) 153/90   10/22/21 (!) 151/73     Wt Readings from Last 3 Encounters:   05/08/22 119.1 kg (262 lb 9.6 oz)   06/11/21 108 kg (238 lb)   02/28/21 106.1 kg (234 lb)     BMI Readings from Last 3 Encounters:   05/08/22 43.70 kg/m?   06/11/21 39.61 kg/m?   02/28/21 38.94 kg/m?      Smoking Social History     Tobacco Use   Smoking Status Never    Passive exposure: Yes   Smokeless Tobacco Never      Lipid Profile Cholesterol   Date Value Ref Range Status   11/16/2017 144 (L) 150 - 200 Final     HDL   Date Value Ref Range Status   11/16/2017 44  Final     LDL   Date Value Ref Range Status   11/16/2017 80  Final     Triglycerides   Date Value Ref Range Status   11/16/2017 146  Final      Blood Sugar No results found for: HGBA1C  Glucose   Date Value Ref Range Status   04/06/2017 76 65 - 99 mg/dL Final     Comment:                   Fasting reference interval        05/30/2015 94 70 - 100 MG/DL Final   16/12/9602 84 70 - 100 MG/DL Final   54/11/8117 91 70 - 100 MG/DL Final     Glucose, POC   Date Value Ref Range Status   05/24/2015 198 (H) 70 - 100 MG/DL Final          Problems Addressed Today  Encounter Diagnoses   Name Primary?    Primary hypertension Yes    Pure hypercholesterolemia     Intractable migraine with status migrainosus, unspecified migraine type     SVT (supraventricular tachycardia) (HCC)     Uncomplicated asthma, unspecified asthma severity, unspecified whether persistent     Atypical chest pain     Cerebral edema (HCC)     Class 2 obesity due to excess calories without serious comorbidity with body mass index (BMI) of 37.0 to 37.9 in adult  Closed fracture of occipital bone, unspecified laterality, unspecified occipital fracture type, initial encounter (HCC)     COVID-19 virus infection     Crohn's disease with complication, unspecified gastrointestinal tract location (HCC)     Heart palpitations     History of lumbar laminectomy     Intraparenchymal hematoma of brain with loss of consciousness of 30 minutes or less, unspecified laterality, initial encounter (HCC)     Morbidly obese (HCC)     Obesity, Class III, BMI 40-49.9 (morbid obesity) (HCC)     Palpitations     Subdural hematoma (HCC)     Cardiovascular symptoms        Assessment and Plan     Assessment:    1.  Preoperative cardiovascular evaluation preceding pain pump implant  It is anticipated this might occur in the future  2.  History of symptomatic heart palpitations-will continue with current dose of beta-blocker  3.  Brief episodes of SVT,?  Atrial fibrillation-previously detected on event monitor, no recurrence  4.  History of mechanical fall  5.  Chronic low back pain, currently wheelchair dependent  6.  No evidence of coronary artery disease  Patient was evaluated with a stress test in June 2018, did not show ischemia, normal LVEF = 83%  7.  Poor functional status due to chronic low back pain and previous mechanical falls  8.  Morbid obesity, elevated BMI = 43.7 kg/m?  9.  History of subdural hematoma due to mechanical fall  10.  History of COVID-19 upper respiratory viral syndrome-this occurred in August 2022, patient did require hospitalization at the time, should recover    Plan:    1.  I did renew the prescription for metoprolol  2.  From a cardiac standpoint this patient is low risk to undergo anticipated procedure of pain pump implant  3.  Follow-up office visit in approximately 1 year      Total Time Today was 40 minutes in the following activities: Preparing to see the patient, Obtaining and/or reviewing separately obtained history, Performing a medically appropriate examination and/or evaluation, Counseling and educating the patient/family/caregiver, Ordering medications, tests, or procedures, Referring and communication with other health care professionals (when not separately reported), Documenting clinical information in the electronic or other health record, Independently interpreting results (not separately reported) and communicating results to the patient/family/caregiver, and Care coordination (not separately reported)          Current Medications (including today's revisions)   acetaminophen (TYLENOL) 500 mg tablet Take two tablets by mouth as Needed.    albuterol sulfate (PROAIR HFA) 90 mcg/actuation HFA aerosol inhaler Inhale one puff by mouth into the lungs every 6 hours as needed for Wheezing or Shortness of Breath. Shake well before use.    allopurinol (ZYLOPRIM) 300 mg tablet Take one tablet by mouth daily.      aspirin 81 mg chewable tablet Chew one tablet by mouth daily. Take with food.    calcium carbonate (TUMS PO) Take  by mouth.    calcium carbonate/vitamin D-3 (OSCAL-500+D) 1250 mg/200 unit tablet Take one tablet by mouth daily. At noon, Calcium Carb 1250mg  delivers 500mg  elemental Ca    cetirizine (ZYRTEC) 10 mg tablet Take one tablet by mouth daily.    ergocalciferol (VITAMIN D-2) 50,000 unit capsule Take one capsule by mouth every 7 days.    estradioL (ESTRACE) 0.01 % (0.1 mg/g) vaginal cream Apply 1 finger tip amount (1 g) of cream vaginally daily for two weeks.  Then apply  3x/wk at bedtime, as directed.    fluticasone propionate (FLONASE) 50 mcg/actuation nasal spray Apply  to each nostril as directed daily. Shake bottle gently before using.    HYDROcodone/acetaminophen (NORCO) 10/325 mg tablet Take one tablet by mouth every 6 hours as needed for Pain.    levothyroxine (SYNTHROID) 50 mcg tablet Take one tablet by mouth daily 30 minutes before breakfast.    loperamide (IMODIUM A-D) 2 mg capsule Take one capsule to two capsules by mouth as Needed.    magnesium oxide (MAGOX) 400 mg (241.3 mg magnesium) tablet Take one tablet by mouth every 24 hours.    metoprolol succinate XL (TOPROL XL) 50 mg extended release tablet TAKE 1 TABLET TWICE DAILY    montelukast (SINGULAIR) 10 mg tablet Take one tablet by mouth at bedtime daily.    MV with Min-Lycopene-Lutein 0.4 mg-300 mcg- 250 mcg tab Take 1 tablet by mouth daily. At noon    pantoprazole DR (PROTONIX) 40 mg tablet Take one tablet by mouth twice daily.    phenazopyridine (PYRIDIUM) 200 mg tablet Take one tablet by mouth three times daily as needed for Pain. Max 3 days.    sulfaSALAzine (AZULFIDINE) 500 mg tablet Take two tablets by mouth three times daily.    vit A/vit C/vit E/zinc/copper (PRESERVISION AREDS PO) Take  by mouth.    Vit B Cmplx 3-FA-Vit C-Biotin 1-60-300 mg-mg-mcg tab Take 1 tablet by mouth daily.    vitamins, B complex tab Take one tablet by mouth daily. At noon

## 2022-08-31 ENCOUNTER — Encounter: Admit: 2022-08-31 | Discharge: 2022-08-31 | Payer: MEDICARE

## 2022-09-02 ENCOUNTER — Encounter: Admit: 2022-09-02 | Discharge: 2022-09-02 | Payer: MEDICARE

## 2022-09-02 NOTE — Telephone Encounter
Returned call after patient LVM asking to have her appointment canceled.  Patient having family personal issues and will plan to call and reschedule appointment when things are a little more settled in her personal life.

## 2022-09-08 ENCOUNTER — Encounter: Admit: 2022-09-08 | Discharge: 2022-09-08 | Payer: MEDICARE

## 2022-09-22 ENCOUNTER — Encounter: Admit: 2022-09-22 | Discharge: 2022-09-22 | Payer: MEDICARE

## 2022-09-22 NOTE — Telephone Encounter
Pt and spouse called into the nursing line requesting an appt with Dr. Avie Arenas.  Mindee states that about 3-4 months ago, she had a fall and broke her right fibula.  About a month after the fall, she started having swelling in both her feet and legs.  She states, the swelling is pretty bad and my legs feel tight, like logs.  She has seen her PCP Dwaine Gale, APRN who recommended she reach out to cardiology.  Lanah states that she did try Lasix for 3 days, but did not see much improvement in her swelling.    Raizel does not weigh herself regularly.  She states that her weight continues to go up because she has difficulty walking.  She has to use a walker or wheelchair.  She has had multiple falls over the past 2 years.  She feels like the swelling is making it harder for her to walk.   She states that her BP is up and down, but does not have any readings available.  She states that she does occasionally notice some shortness of breath, mostly when resting.  She states that it takes a lot of energy to walk around.  Shewanda would like to see [facility] at earliest availability.  She states her PCP thought she might need some labs and an echocardiogram, but thought that should come from our office.    Scheduled pt with [facility] in July for evaluation of symptoms.     Will discuss with [facility] and follow up with Karena Addison with recommendations.

## 2022-10-06 ENCOUNTER — Encounter: Admit: 2022-10-06 | Discharge: 2022-10-06 | Payer: MEDICARE

## 2022-10-10 ENCOUNTER — Encounter: Admit: 2022-10-10 | Discharge: 2022-10-10 | Payer: MEDICARE

## 2022-10-10 MED ORDER — METOPROLOL SUCCINATE 50 MG PO TB24
50 mg | ORAL_TABLET | Freq: Two times a day (BID) | ORAL | 3 refills | 90.00000 days | Status: AC
Start: 2022-10-10 — End: ?

## 2022-10-16 ENCOUNTER — Encounter: Admit: 2022-10-16 | Discharge: 2022-10-16 | Payer: MEDICARE

## 2022-10-19 ENCOUNTER — Encounter: Admit: 2022-10-19 | Discharge: 2022-10-19 | Payer: MEDICARE

## 2022-12-25 ENCOUNTER — Encounter: Admit: 2022-12-25 | Discharge: 2022-12-25 | Payer: MEDICARE

## 2023-04-13 ENCOUNTER — Encounter: Admit: 2023-04-13 | Discharge: 2023-04-13 | Payer: MEDICARE

## 2023-04-14 ENCOUNTER — Encounter: Admit: 2023-04-14 | Discharge: 2023-04-14 | Payer: MEDICARE

## 2023-05-07 ENCOUNTER — Encounter: Admit: 2023-05-07 | Discharge: 2023-05-07 | Payer: MEDICARE

## 2023-08-21 ENCOUNTER — Encounter: Admit: 2023-08-21 | Discharge: 2023-08-21 | Payer: MEDICARE

## 2023-09-29 ENCOUNTER — Encounter: Admit: 2023-09-29 | Discharge: 2023-09-29 | Payer: MEDICARE

## 2023-11-24 ENCOUNTER — Encounter: Admit: 2023-11-24 | Discharge: 2023-11-24 | Payer: MEDICARE

## 2023-11-24 ENCOUNTER — Ambulatory Visit: Admit: 2023-11-24 | Discharge: 2023-11-24 | Payer: MEDICARE

## 2023-11-24 DIAGNOSIS — E7800 Pure hypercholesterolemia, unspecified: Secondary | ICD-10-CM

## 2023-11-24 DIAGNOSIS — S065XAA Subdural hematoma (CMS-HCC): Secondary | ICD-10-CM

## 2023-11-24 DIAGNOSIS — Z9889 Other specified postprocedural states: Secondary | ICD-10-CM

## 2023-11-24 DIAGNOSIS — R0989 Other specified symptoms and signs involving the circulatory and respiratory systems: Principal | ICD-10-CM

## 2023-11-24 DIAGNOSIS — S06331A Contusion and laceration of cerebrum, unspecified, with loss of consciousness of 30 minutes or less, initial encounter: Secondary | ICD-10-CM

## 2023-11-24 DIAGNOSIS — R002 Palpitations: Secondary | ICD-10-CM

## 2023-11-24 DIAGNOSIS — G43911 Migraine, unspecified, intractable, with status migrainosus: Secondary | ICD-10-CM

## 2023-11-24 DIAGNOSIS — M545 Chronic bilateral low back pain without sciatica: Secondary | ICD-10-CM

## 2023-11-24 DIAGNOSIS — I471 SVT (supraventricular tachycardia): Secondary | ICD-10-CM

## 2023-11-24 DIAGNOSIS — I10 Essential (primary) hypertension: Secondary | ICD-10-CM

## 2023-11-24 DIAGNOSIS — R32 Unspecified urinary incontinence: Secondary | ICD-10-CM

## 2023-11-24 NOTE — Progress Notes
 Date of Service: 11/24/2023    Jeanette Bush is a 78 y.o. female.       HPI      Jeanette Bush is a 78 y.o. female with a history of primary hypertension, symptomatic heart palpitations, brief episodes of atrial fibrillation, SVT previously detected on a event monitor performed in March 2011 (no recurrence, patient has been on a beta-blocker), morbid obesity, current BMI 44.71 kg/m?, wheelchair dependent, fibromyalgia, history of previous laminectomy, history of fall that resulted in bilateral subdural hematoma (February - March 2017), chronic low back pain, status post spinal cord stimulator, repeated mechanical falls overall very poor functional status mainly due to the disabling obesity.    From a cardiac standpoint patient remains stable.  She does not report having symptoms of chest pain, no heart palpitations.    She does continue on metoprolol .    Due to morbid obesity and subsequent low back pain, previous surgical intervention patient has a very poor functional status, she is walker/wheelchair dependent.         Vitals:    11/24/23 1103   BP: (!) 145/78   BP Source: Arm, Right Lower   Pulse: 59   SpO2: 93%   PainSc: Seven   Weight: 120.1 kg (264 lb 12.8 oz)   Height: 163.8 cm (5' 4.5)     Body mass index is 44.75 kg/m?SABRA     Past Medical History  Patient Active Problem List    Diagnosis Date Noted    COVID-19 virus infection 02/28/2021    History of lumbar laminectomy 02/28/2021    COVID-19 virus infection 02/28/2021    Class 2 obesity due to excess calories in adult 01/01/2016    Cervical spine pain 05/24/2015    Cerebral edema (CMS-HCC) 05/24/2015    Intraparenchymal hematoma of brain (CMS-HCC) 05/24/2015    Closed fracture of occipital bone (CMS-HCC) 05/24/2015    Subdural hematoma (CMS-HCC) 05/23/2015    Altered mental status 05/23/2015    Fall 05/23/2015    Heart palpitations 07/21/2013    Pre-operative cardiovascular examination 06/25/2011    Obesity, Class III, BMI 40-49.9 (morbid obesity) (CMS-HCC) 06/25/2011    Atypical chest pain 09/13/2010    Morbidly obese (HCC) 09/13/2010    Left axis deviation 09/13/2010    Hyperlipidemia 06/12/2010    SVT (supraventricular tachycardia) (HCC) 01/11/2010    Palpitations 01/11/2010    Crohn's disease (CMS-HCC) 06/14/2009    Fibromyalgia 06/14/2009    RLS (restless legs syndrome) 06/14/2009    Asthma 06/14/2009    HTN (hypertension) 06/14/2009    Osteoporosis 06/14/2009    Migraine 06/14/2009    Chronic bilateral low back pain without sciatica 06/14/2009    Urinary incontinence 06/14/2009         Review of Systems   Constitutional: Positive for malaise/fatigue.   HENT: Negative.     Eyes: Negative.    Cardiovascular:  Positive for dyspnea on exertion and leg swelling.   Respiratory:  Positive for shortness of breath.    Endocrine: Negative.    Hematologic/Lymphatic: Negative.    Skin: Negative.    Musculoskeletal:  Positive for falls, joint pain and muscle cramps.   Gastrointestinal: Negative.    Genitourinary: Negative.    Neurological:  Positive for weakness.   Psychiatric/Behavioral: Negative.     Allergic/Immunologic: Negative.        Physical Exam  General Appearance: Appears chronically ill, in a wheelchair, BMI = 44.75 kg/m?  Skin: warm, moist, no ulcers or xanthomas  Eyes: conjunctivae and lids normal, pupils are equal and round  Lips & Oral Mucosa: no pallor or cyanosis  Neck Veins: neck veins are flat, neck veins are not distended  Chest Inspection: chest is normal in appearance  Respiratory Effort: breathing comfortably, no respiratory distress  Auscultation/Percussion: lungs clear to auscultation, no rales or rhonchi, no wheezing  Cardiac Rhythm: regular rhythm and normal rate  Cardiac Auscultation: S1, S2 normal, no rub, no gallop  Murmurs: no murmur  Carotid Arteries: normal carotid upstroke bilaterally, no bruit  Lower Extremity Edema: no lower extremity edema  Abdominal Exam: soft, non-tender, no masses, bowel sounds normal  Liver & Spleen: no organomegaly  Language and Memory: patient responsive and seems to comprehend information  Neurologic Exam: neurological assessment grossly intact      Cardiovascular Studies  Twelve-lead EKG demonstrates normal sinus rhythm, ventricular rate 54 bpm, poor R wave progression across the precordium, no axis deviation.    Cardiovascular Health Factors  Vitals BP Readings from Last 3 Encounters:   11/24/23 (!) 145/78   05/08/22 (!) 148/70   11/26/21 (!) 153/90     Wt Readings from Last 3 Encounters:   11/24/23 120.1 kg (264 lb 12.8 oz)   05/08/22 119.1 kg (262 lb 9.6 oz)   06/11/21 108 kg (238 lb)     BMI Readings from Last 3 Encounters:   11/24/23 44.75 kg/m?   05/08/22 43.70 kg/m?   06/11/21 39.61 kg/m?      Smoking Tobacco Use History[1]   Lipid Profile Cholesterol   Date Value Ref Range Status   11/16/2017 144 (L) 150 - 200 Final     HDL   Date Value Ref Range Status   11/16/2017 44  Final     LDL   Date Value Ref Range Status   11/16/2017 80  Final     Triglycerides   Date Value Ref Range Status   11/16/2017 146  Final      Blood Sugar No results found for: HGBA1C  Glucose   Date Value Ref Range Status   04/06/2017 76 65 - 99 mg/dL Final     Comment:                   Fasting reference interval        05/30/2015 94 70 - 100 MG/DL Final   97/71/7982 84 70 - 100 MG/DL Final     Glucose, POC   Date Value Ref Range Status   05/24/2015 198 (H) 70 - 100 MG/DL Final          Problems Addressed Today  Encounter Diagnoses   Name Primary?    Cardiovascular symptoms Yes    Morbidly obese (CMS-HCC)     SVT (supraventricular tachycardia) (HCC)     Intractable migraine with status migrainosus, unspecified migraine type     Pure hypercholesterolemia     Primary hypertension     Urinary incontinence, unspecified type     Subdural hematoma (CMS-HCC)     Palpitations     Intraparenchymal hematoma of brain with loss of consciousness of 30 minutes or less, unspecified laterality, initial encounter (CMS-HCC)     History of lumbar laminectomy     Chronic bilateral low back pain without sciatica        Assessment and Plan        Assessment:    1.  Primary hypertension-borderline today in the office  2.  History of heart palpitations-no recurrence, continues on a beta-blocker  3.  History of SVT,?  Atrial fibrillation-prior detected on a Holter monitor, no recurrence  4.  No evidence of obstructive CAD-patient was evaluated with a stress test in June 2018, no ischemia, LVEF = 83%  5.  History of numerous mechanical falls  6.  Morbid obesity, BMI = 44.75 kg/m?  7.  Wheelchair dependent  8.  Status post prior laminectomy, chronic generalized pain-patient underwent pain management pump implant    Plan:    1.  Continue all current medications  2.  No further cardiac evaluation at point in time  3.  Follow-up in March - April 2026 or perhaps even as needed.      Total Time Today was 30 minutes in the following activities: Preparing to see the patient, Obtaining and/or reviewing separately obtained history, Performing a medically appropriate examination and/or evaluation, Counseling and educating the patient/family/caregiver, Ordering medications, tests, or procedures, Referring and communication with other health care professionals (when not separately reported), Documenting clinical information in the electronic or other health record, Independently interpreting results (not separately reported) and communicating results to the patient/family/caregiver, and Care coordination (not separately reported)          Current Medications (including today's revisions)   acetaminophen (TYLENOL) 500 mg tablet Take two tablets by mouth as Needed.    albuterol sulfate (PROAIR HFA) 90 mcg/actuation HFA aerosol inhaler Inhale one puff by mouth into the lungs every 6 hours as needed for Wheezing or Shortness of Breath. Shake well before use.    allopurinol  (ZYLOPRIM ) 300 mg tablet Take one tablet by mouth daily.      aspirin 81 mg chewable tablet Chew one tablet by mouth daily. Take with food.    calcium carbonate (TUMS PO) Take  by mouth.    calcium carbonate/vitamin D-3 (OSCAL-500+D) 1250 mg/200 unit tablet Take one tablet by mouth daily. At noon, Calcium Carb 1250mg  delivers 500mg  elemental Ca    cetirizine (ZYRTEC) 10 mg tablet Take one tablet by mouth daily.    cholestyramine-sucrose (QUESTRAN) 4 gram packet Take one packet by mouth daily.    duloxetine DR (CYMBALTA) 20 mg capsule Take one capsule by mouth daily.    ergocalciferol (VITAMIN D-2) 50,000 unit capsule Take one capsule by mouth every 7 days.    estradioL  (ESTRACE ) 0.01 % (0.1 mg/g) vaginal cream Apply 1 finger tip amount (1 g) of cream vaginally daily for two weeks.  Then apply 3x/wk at bedtime, as directed.    fluticasone propionate (FLONASE) 50 mcg/actuation nasal spray Apply  to each nostril as directed daily. Shake bottle gently before using.    GEMTESA 75 mg tablet Take one tablet by mouth daily.    HYDROcodone/acetaminophen (NORCO) 10/325 mg tablet Take one tablet by mouth every 6 hours as needed for Pain.    KLAYESTA 100,000 unit/gram topical powder Apply one g topically to affected area twice daily.    levothyroxine  (SYNTHROID ) 50 mcg tablet Take one tablet by mouth daily 30 minutes before breakfast.    lidocaine (LIDODERM) 5 % topical patch Apply one patch topically to affected area as Needed.    loperamide (IMODIUM A-D) 2 mg capsule Take one capsule to two capsules by mouth as Needed.    magnesium oxide (MAGOX) 400 mg (241.3 mg magnesium) tablet Take one tablet by mouth every 24 hours.    metoprolol  succinate XL (TOPROL  XL) 50 mg extended release tablet TAKE 1 TABLET TWICE DAILY    montelukast (SINGULAIR) 10 mg tablet Take one tablet by mouth  at bedtime daily.    MV with Min-Lycopene-Lutein 0.4 mg-300 mcg- 250 mcg tab Take 1 tablet by mouth daily. At noon    pantoprazole DR (PROTONIX) 40 mg tablet Take one tablet by mouth twice daily.    phenazopyridine  (PYRIDIUM ) 200 mg tablet Take one tablet by mouth three times daily as needed for Pain. Max 3 days.    sulfaSALAzine (AZULFIDINE) 500 mg tablet Take two tablets by mouth three times daily.    vit A/vit C/vit E/zinc/copper (PRESERVISION AREDS PO) Take  by mouth.    Vit B Cmplx 3-FA-Vit C-Biotin 1-60-300 mg-mg-mcg tab Take 1 tablet by mouth daily.    vitamins, B complex tab Take one tablet by mouth daily. At noon                 [1]   Social History  Tobacco Use   Smoking Status Never    Passive exposure: Yes   Smokeless Tobacco Never

## 2024-04-12 ENCOUNTER — Encounter: Admit: 2024-04-12 | Discharge: 2024-04-12 | Payer: MEDICARE
